# Patient Record
Sex: Female | Born: 1975 | Race: White | Hispanic: Yes | Marital: Married | Smoking: Never smoker
Health system: Southern US, Community
[De-identification: ages and names within clinical notes are randomized; demographics above are authoritative.]

## PROBLEM LIST (undated history)

## (undated) DIAGNOSIS — O24419 Gestational diabetes mellitus in pregnancy, unspecified control: Secondary | ICD-10-CM

## (undated) DIAGNOSIS — E119 Type 2 diabetes mellitus without complications: Secondary | ICD-10-CM

## (undated) HISTORY — DX: Gestational diabetes mellitus in pregnancy, unspecified control: O24.419

## (undated) HISTORY — PX: NO PAST SURGERIES: SHX2092

## (undated) HISTORY — DX: Type 2 diabetes mellitus without complications: E11.9

---

## 1998-06-04 ENCOUNTER — Inpatient Hospital Stay (HOSPITAL_COMMUNITY): Admission: AD | Admit: 1998-06-04 | Discharge: 1998-06-04 | Payer: Self-pay | Admitting: *Deleted

## 2000-01-07 ENCOUNTER — Emergency Department (HOSPITAL_COMMUNITY): Admission: EM | Admit: 2000-01-07 | Discharge: 2000-01-07 | Payer: Self-pay | Admitting: Emergency Medicine

## 2001-10-19 ENCOUNTER — Inpatient Hospital Stay (HOSPITAL_COMMUNITY): Admission: AD | Admit: 2001-10-19 | Discharge: 2001-10-21 | Payer: Self-pay | Admitting: *Deleted

## 2003-08-19 ENCOUNTER — Inpatient Hospital Stay (HOSPITAL_COMMUNITY): Admission: AD | Admit: 2003-08-19 | Discharge: 2003-08-19 | Payer: Self-pay | Admitting: *Deleted

## 2003-09-03 ENCOUNTER — Encounter: Admission: RE | Admit: 2003-09-03 | Discharge: 2003-09-03 | Payer: Self-pay | Admitting: *Deleted

## 2003-09-12 ENCOUNTER — Encounter: Admission: RE | Admit: 2003-09-12 | Discharge: 2003-10-28 | Payer: Self-pay | Admitting: Family Medicine

## 2003-10-01 ENCOUNTER — Encounter: Admission: RE | Admit: 2003-10-01 | Discharge: 2003-10-01 | Payer: Self-pay | Admitting: *Deleted

## 2003-10-08 ENCOUNTER — Encounter: Admission: RE | Admit: 2003-10-08 | Discharge: 2003-10-08 | Payer: Self-pay | Admitting: *Deleted

## 2003-10-15 ENCOUNTER — Encounter: Admission: RE | Admit: 2003-10-15 | Discharge: 2003-10-15 | Payer: Self-pay | Admitting: *Deleted

## 2003-10-22 ENCOUNTER — Encounter: Admission: RE | Admit: 2003-10-22 | Discharge: 2003-10-22 | Payer: Self-pay | Admitting: *Deleted

## 2003-10-28 ENCOUNTER — Inpatient Hospital Stay (HOSPITAL_COMMUNITY): Admission: AD | Admit: 2003-10-28 | Discharge: 2003-10-30 | Payer: Self-pay | Admitting: Family Medicine

## 2005-07-30 ENCOUNTER — Ambulatory Visit: Payer: Self-pay | Admitting: Internal Medicine

## 2005-07-30 ENCOUNTER — Ambulatory Visit: Payer: Self-pay | Admitting: *Deleted

## 2008-01-15 LAB — SICKLE CELL SCREEN: Sickle Cell Screen: NORMAL

## 2008-02-09 ENCOUNTER — Ambulatory Visit: Payer: Self-pay | Admitting: Internal Medicine

## 2008-02-20 ENCOUNTER — Encounter: Payer: Self-pay | Admitting: Family Medicine

## 2008-02-20 ENCOUNTER — Ambulatory Visit: Payer: Self-pay | Admitting: Internal Medicine

## 2008-02-20 LAB — CONVERTED CEMR LAB
ALT: 19 units/L (ref 0–35)
AST: 16 units/L (ref 0–37)
Albumin: 4.5 g/dL (ref 3.5–5.2)
Alkaline Phosphatase: 54 units/L (ref 39–117)
Basophils Relative: 0 % (ref 0–1)
Eosinophils Absolute: 0.1 10*3/uL (ref 0.0–0.7)
Lymphs Abs: 2.5 10*3/uL (ref 0.7–4.0)
MCHC: 33.6 g/dL (ref 30.0–36.0)
MCV: 87.5 fL (ref 78.0–100.0)
Neutrophils Relative %: 58 % (ref 43–77)
Platelets: 298 10*3/uL (ref 150–400)
Potassium: 4 meq/L (ref 3.5–5.3)
Sodium: 141 meq/L (ref 135–145)
Total CHOL/HDL Ratio: 2.6
Total Protein: 7.6 g/dL (ref 6.0–8.3)
VLDL: 13 mg/dL (ref 0–40)
WBC: 7.4 10*3/uL (ref 4.0–10.5)

## 2008-07-19 ENCOUNTER — Ambulatory Visit: Payer: Self-pay | Admitting: Internal Medicine

## 2008-08-15 ENCOUNTER — Ambulatory Visit: Payer: Self-pay | Admitting: Internal Medicine

## 2008-08-15 ENCOUNTER — Encounter: Payer: Self-pay | Admitting: Family Medicine

## 2008-08-15 LAB — CONVERTED CEMR LAB
ALT: 17 units/L (ref 0–35)
AST: 16 units/L (ref 0–37)
Albumin: 4.2 g/dL (ref 3.5–5.2)
CO2: 24 meq/L (ref 19–32)
Calcium: 8.8 mg/dL (ref 8.4–10.5)
Glucose, Bld: 102 mg/dL — ABNORMAL HIGH (ref 70–99)
Sodium: 139 meq/L (ref 135–145)
Total Protein: 7.4 g/dL (ref 6.0–8.3)

## 2008-10-14 ENCOUNTER — Ambulatory Visit: Payer: Self-pay | Admitting: Internal Medicine

## 2009-03-10 ENCOUNTER — Ambulatory Visit: Payer: Self-pay | Admitting: Internal Medicine

## 2009-03-10 ENCOUNTER — Encounter: Payer: Self-pay | Admitting: Family Medicine

## 2009-03-10 LAB — CONVERTED CEMR LAB
AST: 16 units/L (ref 0–37)
Albumin: 4.4 g/dL (ref 3.5–5.2)
Alkaline Phosphatase: 57 units/L (ref 39–117)
Basophils Absolute: 0 10*3/uL (ref 0.0–0.1)
HCT: 40.1 % (ref 36.0–46.0)
Lymphs Abs: 2.8 10*3/uL (ref 0.7–4.0)
MCV: 87.9 fL (ref 78.0–100.0)
Platelets: 301 10*3/uL (ref 150–400)
Potassium: 4.2 meq/L (ref 3.5–5.3)
RDW: 12.5 % (ref 11.5–15.5)
Sodium: 138 meq/L (ref 135–145)
Total Bilirubin: 0.4 mg/dL (ref 0.3–1.2)
Total Protein: 7.2 g/dL (ref 6.0–8.3)
WBC: 8.2 10*3/uL (ref 4.0–10.5)

## 2009-03-12 ENCOUNTER — Ambulatory Visit (HOSPITAL_COMMUNITY): Admission: RE | Admit: 2009-03-12 | Discharge: 2009-03-12 | Payer: Self-pay | Admitting: Family Medicine

## 2012-03-08 ENCOUNTER — Other Ambulatory Visit (HOSPITAL_COMMUNITY): Payer: Self-pay | Admitting: Gynecology

## 2012-03-08 DIAGNOSIS — Z3689 Encounter for other specified antenatal screening: Secondary | ICD-10-CM

## 2012-03-08 LAB — OB RESULTS CONSOLE HGB/HCT, BLOOD
HCT: 33 %
Hemoglobin: 12 g/dL

## 2012-03-08 LAB — OB RESULTS CONSOLE RPR: RPR: NONREACTIVE

## 2012-03-08 LAB — OB RESULTS CONSOLE PLATELET COUNT: Platelets: 273 10*3/uL

## 2012-03-08 LAB — CYTOLOGY - PAP: Pap: NEGATIVE

## 2012-03-08 LAB — OB RESULTS CONSOLE ABO/RH: RH Type: POSITIVE

## 2012-03-09 LAB — GLUCOSE TOLERANCE, 3 HOURS
Glucose, GTT - 3 Hour: 171 mg/dL — AB (ref ?–140)
Glucose, GTT - Fasting: 71 mg/dL — AB (ref 80–110)

## 2012-03-20 ENCOUNTER — Encounter: Payer: Self-pay | Admitting: Obstetrics & Gynecology

## 2012-03-20 ENCOUNTER — Ambulatory Visit (INDEPENDENT_AMBULATORY_CARE_PROVIDER_SITE_OTHER): Payer: Self-pay | Admitting: Obstetrics & Gynecology

## 2012-03-20 ENCOUNTER — Encounter: Payer: Self-pay | Admitting: *Deleted

## 2012-03-20 ENCOUNTER — Encounter: Payer: Self-pay | Attending: Obstetrics & Gynecology | Admitting: Dietician

## 2012-03-20 VITALS — BP 102/65 | Temp 97.4°F | Ht <= 58 in | Wt 118.0 lb

## 2012-03-20 DIAGNOSIS — O99810 Abnormal glucose complicating pregnancy: Secondary | ICD-10-CM | POA: Insufficient documentation

## 2012-03-20 DIAGNOSIS — Z713 Dietary counseling and surveillance: Secondary | ICD-10-CM | POA: Insufficient documentation

## 2012-03-20 DIAGNOSIS — O0990 Supervision of high risk pregnancy, unspecified, unspecified trimester: Secondary | ICD-10-CM | POA: Insufficient documentation

## 2012-03-20 LAB — POCT URINALYSIS DIP (DEVICE)
Hgb urine dipstick: NEGATIVE
Leukocytes, UA: NEGATIVE
Nitrite: NEGATIVE
Specific Gravity, Urine: 1.025 (ref 1.005–1.030)

## 2012-03-20 NOTE — Progress Notes (Signed)
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 Pt has gained 15# @ [redacted]w[redacted]d, which is > expected. Pt reports eating 2-3 meals & 2 snack/d. Pt reports drinking water & milk daily.  Pt able to recall only a few foods that affect her BS. Pt reports no N&V or heartburn. Pt reports taking PNV. Pt reports NKFA. Pt reports walking occ. Pt received written & verbal GDM education in [place]. Reviewed foods that affect BS. Discussed importance of walking to help regulate BS. Disc wt gain goals of 25-35# or 1#/wk.  Pt agrees to eat 3 meals & 3 snacks/ d with proper CHO/ protein combination. Pt does receive WIC. F/u in 2-4 wks Blondell Reveal, MS, RD, LDN

## 2012-03-20 NOTE — Progress Notes (Signed)
Pt has had GDM with all 4 pregnancies.  Controlled with diet.  Needs diabetes education.  Has anatomy scan scheduled.  Needs rest of labs from health department.  Had complete physical exam and pap smear there.  Husband is considering vasectomy.  Pt refuses genetic testing.

## 2012-03-20 NOTE — Progress Notes (Signed)
Pulse- 96 

## 2012-03-20 NOTE — Progress Notes (Signed)
Diabetes Education:  Seen for the first time today.  Review of the physiology of pregnancy.  She speaks [place] and I proceeded slowly with the instruction.  Alex the Veterinary surgeon checked with her to inquire if there was any thing that she did not understand.  Review of the factors that influence blood glucose, the S/S and treatment of hypoglycemia.  Provided a True Track meter, Lot: [id number] Exp: 2014/02/18, 1 box strips [id number] EXP: 2014/04/08, and 1 box of lancets [id number] Exp: 2015/10/04.  Provided meter instructions and on return demonstration her blood glucose fasting this AM at 9:30 was 107.  Provide English and Spanish handouts for GDM "Nutrition, Diabetes and Pregnancy".  Maggie [place], RN, RD, CDE.

## 2012-04-03 ENCOUNTER — Ambulatory Visit (HOSPITAL_COMMUNITY)
Admission: RE | Admit: 2012-04-03 | Discharge: 2012-04-03 | Disposition: A | Discharge status: Home / Self Care | Payer: Self-pay | Source: Ambulatory Visit | Attending: Gynecology | Admitting: Gynecology

## 2012-04-03 ENCOUNTER — Encounter: Payer: Self-pay | Admitting: Family Medicine

## 2012-04-03 ENCOUNTER — Ambulatory Visit (INDEPENDENT_AMBULATORY_CARE_PROVIDER_SITE_OTHER): Payer: Self-pay | Admitting: Family Medicine

## 2012-04-03 VITALS — BP 103/69 | Temp 97.0°F | Wt 119.8 lb

## 2012-04-03 DIAGNOSIS — O99810 Abnormal glucose complicating pregnancy: Secondary | ICD-10-CM | POA: Insufficient documentation

## 2012-04-03 DIAGNOSIS — O0990 Supervision of high risk pregnancy, unspecified, unspecified trimester: Secondary | ICD-10-CM

## 2012-04-03 DIAGNOSIS — Z3689 Encounter for other specified antenatal screening: Secondary | ICD-10-CM | POA: Insufficient documentation

## 2012-04-03 DIAGNOSIS — Z23 Encounter for immunization: Secondary | ICD-10-CM

## 2012-04-03 LAB — COMPREHENSIVE METABOLIC PANEL
ALT: 31 U/L (ref 0–35)
CO2: 21 mEq/L (ref 19–32)
Calcium: 9 mg/dL (ref 8.4–10.5)
Chloride: 105 mEq/L (ref 96–112)
Creat: 0.52 mg/dL (ref 0.50–1.10)
Glucose, Bld: 87 mg/dL (ref 70–99)

## 2012-04-03 LAB — POCT URINALYSIS DIP (DEVICE)
Bilirubin Urine: NEGATIVE
Glucose, UA: NEGATIVE mg/dL
Hgb urine dipstick: NEGATIVE
Nitrite: NEGATIVE
Urobilinogen, UA: 1 mg/dL (ref 0.0–1.0)

## 2012-04-03 LAB — HEMOGLOBIN A1C
Hgb A1c MFr Bld: 5.7 % — ABNORMAL HIGH (ref <5.7)
Mean Plasma Glucose: 117 mg/dL — ABNORMAL HIGH (ref <117)

## 2012-04-03 LAB — TSH: TSH: 2.511 u[IU]/mL (ref 0.350–4.500)

## 2012-04-03 MED ORDER — INFLUENZA VIRUS VACC SPLIT PF IM SUSP
0.5000 mL | Freq: Once | INTRAMUSCULAR | Status: AC
  Administered 2012-04-03: 0.5 mL via INTRAMUSCULAR

## 2012-04-03 NOTE — Progress Notes (Signed)
Forgot BS today had anatomy upstairs this morning.Will get baseline labs, 24 hour urine, TSH, Hgb A1C Optho and Fetal Echo

## 2012-04-03 NOTE — Progress Notes (Signed)
Pulse: 85

## 2012-04-03 NOTE — Patient Instructions (Signed)
Diabetes mellitus gestacional (Gestational Diabetes Mellitus) La diabetes mellitus gestacional se produce slo durante el embarazo. Aparece cuando el organismo no puede controlar adecuadamente la glucosa (azcar) que aumenta en la sangre despus de comer. Durante el embarazo, se produce una resistencia a la insulina (sensibilidad reducida a la insulina) debido a la liberacin de hormonas por parte de la placenta. Generalmente, el pncreas de una mujer embarazada produce la cantidad suficiente de insulina para vencer esa resistencia. Sin embargo, en la diabetes gestacional, hay insulina pero no cumple su funcin adecuadamente. Si la resistencia es lo suficientemente grave como para que el pncreas no produzca la cantidad de insulina suficiente, la glucosa extra se acumula en la sangre.  QUINES TIENEN RIESGO DE DESARROLLAR DIABETES GESTACIONAL?  Las mujeres con historia de diabetes en la familia.  Las mujeres de ms de 25 aos.  Las que presentan sobrepeso.  Las mujeres que pertenecen a ciertos grupos tnicos (latinas, afroamericanas, norteamericanas nativas, asiticas y las originarias de las islas del Pacfico. QUE PUEDE OCURRIRLE AL BEB? Si el nivel de glucosa en sangre de la madre es demasiado elevado mientras este embarazada, el nivel extra de azcar pasar por el cordn umbilical hacia el beb. Algunos de los problemas del beb pueden ser:  Beb demasiado grande: si el nio recibe demasiada azcar, puede aumentar mucho de peso. Esto puede hacer que sea demasiado grande para nacer por parto normal (vaginal) por lo que ser necesario realizar una cesrea.  Bajo nivel de glucosa (hipoglucemia): el beb produce insulina extra en respuesta a la excesiva cantidad de azcar que obtiene de la madre. Cuando el beb nace y ya no necesita insulina extra, su nivel de azcar en sangre puede disminuir.  Ictericia (coloracin amarillenta de la piel y los ojos): esto es bastante frecuente en los bebs.  La causa es la acumulacin de una sustancia qumica denominada bilirrubina. No siempre es un trastorno grave, pero se observa con frecuencia en los bebs cuyas madres sufren diabetes gestacional. RIESGOS PARA LA MADRE Las mujeres que han sufrido diabetes gestacional pueden tener ms riesgos para algunos problemas como:  Preeclampsia o toxemia, incluyendo problemas con hipertensin arterial. La presin arterial y los niveles de protenas en la orina deben controlarse con frecuencia.  Infecciones  Parto por cesrea.  Aparicin de diabetes tipo 2 en una etapa posterior de la vida. Alrededor del 30% al 50% sufrir diabetes posteriormente, especialmente las que son obesas. DIAGNSTICO Las hormonas que causan resistencia a la insulina tienen su mayor nivel alrededor de las 24 a 28 semanas del embarazo. Si se experimentan sntomas, stos son similares a los sntomas que normalmente aparecen durante el embarazo.  La diabetes mellitus gestacional generalmente se diagnostica por medio de un mtodo en dos partes: 1. Despus de la 24 a 28 semanas de embarazo, la mujer debe beber una solucin que contiene glucosa y realizar un anlisis de sangre. Si el nivel de glucosa es elevado, la realizarn un segundo anlisis. 2. La prueba oral de tolerancia a la glucosa, que dura aproximadamente tres horas. Despus de realizar ayuno durante la noche, se controla nivel de glucosa en sangre. La mujer bebe una solucin que contiene glucosa y le realizan anlisis de glucosa en sangre cada hora. Si la mujer tiene factores de riesgos para la diabetes mellitus gestacional, el mdico podr indicar el anlisis antes de las 24 semanas de embarazo. TRATAMIENTO El tratamiento est dirigido a mantener la glucosa en sangre de la madre en un nivel normal y puede incluir:  La   planificacin de los alimentos.  Recibir insulina u otro medicamento para Sales executive nivel de glucosa en [place].  La prctica de ejercicios.  Llevar un  registro diario de los alimentos que consume.  Control y Engineer, maintenance (IT) de los niveles de glucosa en [place].  Control de los niveles de cetona en la [place], [place] esto ya no se considera necesario en la mayora de los [place]. INSTRUCCIONES PARA EL CUIDADO DOMICILIARIO Mientras est embarazada:  Siga los consejos de su mdico relacionados con los controles prenatales, la planificacin de la comida, la actividad fsica, los [place], vitaminas, los anlisis de sangre y otras pruebas y las actividades fsicas.  Lleve un registro de las comidas, las pruebas de glucosa en sangre y la cantidad de insulina que recibe (si corresponde). Muestre todo al profesional en cada consulta mdica prenatal.  Si sufre diabetes mellitus gestacional, podr tener problemas de hipoglucemia (nivel bajo de glucosa en sangre). Podr sospechar este problema si se siente repentinamente mareada, tiene temblores y/o se siente dbil. Si cree que esto le est ocurriendo, y tiene un medidor de glucosa, mida su nivel de Event organiser. Siga los consejos de su mdico sobre el modo y el momento de tratar su nivel de glucosa en sangre. Generalmente se sigue la regla 15:15 Consuma 15 g de hidratos de carbono, espere 15 minutos y Programmer, systems el nivel de glucosa en [place].Barbara Cower de 15 g de hidratos de carbono son:  1 taza de [employer].   taza de jugo.  3-4 tabletas de glucosa.  5-6 caramelos duros.  1 caja pequea de pasas de uva.   taza de gaseosa comn.  Mantenga una buena higiene para evitar infecciones.  No fume. SOLICITE ATENCIN MDICA SI:  Observa prdida vaginal con o sin picazn.  Se siente ms dbil o cansada que lo habitual.  Primus [place]ravo.  Tiene un aumento de peso repentino, 2,5 kg o ms en una semana.  Pierde peso, 1.5 kg o ms en una semana.  Su nivel de glucosa en sangre es elevado, necesita instrucciones. SOLICITE ATENCIN MDICA DE INMEDIATO SI:  Sufre una cefalea  intensa.  Se marea o pierde el conocimiento  Presenta nuseas o vmitos.  Se siente desorientada confundida.  Sufre convulsiones.  Tiene problemas de visin.  Siente Physiological scientist.  Presenta una hemorragia vaginal abundante.  Tiene contracciones uterinas.  Tiene una prdida importante de lquido por la vagina DESPUS QUE NACE EL BEB:  Concurra a todos los controles de seguimiento y Clinical biochemist los anlisis de sangre segn las indicaciones de su mdico.  Mantenga un estilo de vida saludable para evitar la diabetes en el futuro. Aqu se incluye:  Siga el plan de alimentacin saludable.  Controle su peso.  Practique actividad fsica y descanse lo necesario.  No fume.  Amamante a su beb mientras pueda. Esto disminuir la probabilidad de que usted y su beb sufran diabetes posteriormente. Para ms informacin acerca de la diabetes, visite la pgina web de Holiday representative Diabetes Association: [email or web address].cy. Para ms informacin acerca de la diabetes gestacional cite la pgina web del [employer] en: [email or web address]. Document Released: 01/27/2005 Document Revised: 07/12/2011 [facility] Patient Information 2013 [place], [place].  Eleccin del mtodo anticonceptivo  (Contraception Choices) La anticoncepcin (control de la natalidad) es el uso de cualquier mtodo o dispositivo para Location manager. A continuacin se indican algunos de esos mtodos.  MTODOS HORMONALES   Implante anticonceptivo. Es un tubo plstico delgado que contiene la hormona progesterona.  No contiene estrgenos. El mdico inserta el tubo en la parte interna del brazo. El tubo puede Geneticist, molecular durante 3 aos. Despus de los 3 aos debe retirarse. El implante impide que los ovarios liberen vulos (ovulacin), espesa el moco cervical, lo que evita que los espermatozoides ingresen al tero y hace ms delgada la membrana que cubre el interior  del tero.  Inyecciones de progesterona sola. Estas inyecciones se administran cada 3 meses para evitar el embarazo. La progesterona sinttica impide que los ovarios liberen vulos. Tambin hace que el moco cervical se espese y modifica el recubrimiento interno del tero. Esto hace ms difcil que los espermatozoides sobrevivan en el tero.  Pldoras anticonceptivas. Las pldoras anticonceptivas contienen estrgenos y Education officer, museum. Actan impidiendo que el vulo se forme en el ovario(ovulacin). Las pldoras anticonceptivas son recetadas por el mdico.Tambin se utilizan para tratar los perodos menstruales abundantes.  Minipldora. Este tipo de pldora anticonceptiva contiene slo hormona progesterona. Deben tomarse todos los [address]  [address] del mes y debe recetarlas el mdico.  Parches anticonceptivos. El parche contiene hormonas similares a las que contienen las pldoras anticonceptivas. Deben cambiarse una vez por semana y se utilizan bajo prescripcin mdica.  Anillo vaginal. Anillo vaginal contiene hormonas similares a las que contienen las pldoras anticonceptivas. Se deja colocado durante tres semanas, se lo retira durante 1 semana y luego se coloca uno nuevo. La paciente debe sentirse cmoda para insertar y retirar el anillo de la vagina.Es necesaria la receta del mdico.  Anticonceptivos de Associate Professor. Los anticonceptivos de emergencia son mtodos para evitar un embarazo despus de una relacin sexual sin proteccin. Esta pldora puede tomarse inmediatamente despus de Child psychotherapist sexuales o hasta 5 [place] de haber tenido sexo sin proteccin. Es ms efectiva si se toma poco tiempo despus. Los anticonceptivos de emergencia estn disponibles sin prescripcin mdica. Consltelo con su farmacutico. No use los anticonceptivos de emergencia como nico mtodo anticonceptivo. MTODOS DE BARRERA   Condn masculino. Es una vaina delgada (ltex o goma) que se [place] en el pene durante el acto sexual. Deri Fuelling con espermicida para aumentar la efectividad.  Condn femenino. Es una vaina blanda y floja que se adapta suavemente a la vagina antes de las relaciones sexuales.  Diafragma. Es una barrera de ltex redonda y Casimer Bilis que debe ser ajustada por un profesional. Se inserta en la vagina, junto con un gel espermicida. Debe insertarse antes de Management consultant. Debe dejar el diafragma colocado en la vagina durante 6 a 8 horas despus de la relacin sexual.  Capuchn cervical. Es una taza de ltex o plstico, redonda y [place] que cubre el cuello del tero y debe ser ajustada por un mdico. Puede dejarlo colocado en la vagina hasta 48 horas despus de las Clinical research associate.  Esponja. Es una pieza blanda y circular de espuma de poliuretano. Contiene un espermicida. Se inserta en la vagina despus de mojarla y antes de las [employer].  Espermicidas. Los espermicidas son qumicos que matan o bloquean el esperma y no lo dejan ingresar al cuello del tero y al tero. Vienen en forma de cremas, geles, supositorios, espuma o comprimidos. No es necesario tener Emergency planning/management officer. Se insertan en la vagina con un aplicador antes de Management consultant. El proceso debe repetirse cada vez que tiene relaciones sexuales. ANTICONCEPTIVOS INTRAUTERINOS   Dispositivo intrauterino (DIU). Es un dispositivo en forma de T que se coloca en el tero durante el perodo menstrual, para Location manager. Hay dos tipos:  DIU de  cobreCleda Clarks tipo de DIU est recubierto con un alambre de cobre y se inserta dentro del tero. El cobre hace que el tero y las trompas de Falopio produzcan un liquido que [employer] espermatozoides. Puede permanecer colocado durante 10 aos.  DIU hormonal. Este tipo de DIU contiene la hormona progestina (progesterona sinttica). La hormona espesa el moco cervical y evita que los espermatozoides ingresen al tero y tambin afina la membrana que cubre el tero para evitar la  implantacin del vulo fertilizado. La hormona debilita o destruye los espermatozoides que ingresan al tero. Puede permanecer colocado durante 5 aos. MTODOS ANTICONCEPTIVOS PERMANENTES   Ligadura de trompas en la mujer. La ligadura de trompas en la mujer se realiza sellando, atando u obstruyendo quirrgicamente las trompas de Falopio lo que impide que el vulo descienda hacia el tero.  Esterilizacin masculina. Se realiza atando los conductos por los que pasan los espermatozoides (vasectoma).Esto impide que el esperma ingrese a la vagina durante el acto sexual. Luego del procedimiento, el hombre puede eyacular lquido (semen). MTODOS DE PLANIFICACIN NATURAL   Planificacin familiar natural.  Consiste en no tener relaciones sexuales o usar un mtodo de barrera (condn, [place], capuchn cervical) en los [employer] la mujer podra quedar [place].  Mtodo calendario.  Consiste en el seguimiento de la duracin de cada ciclo menstrual y la identificacin de los perodos frtiles.  Mtodo de Occupational hygienist.  Consiste en evitar las relaciones sexuales durante la ovulacin.  Mtodo sintotrmico. Paramedic las relaciones sexuales en la poca en la que se est ovulando, utilizando un termmetro y tendiendo en cuenta los sntomas de la ovulacin.  Mtodo post-ovulacin. Consiste en planificar las relaciones sexuales para despus de haber ovulado. Independientemente del tipo o mtodo anticonceptivo que usted elija, es importante que use condones para protegerse contra las enfermedades de transmisin sexual (ETS). Hable con su mdico con respecto a qu mtodo anticonceptivo es el ms apropiado para usted.  Document Released: 04/19/2005 Document Revised: 07/12/2011 [facility] Patient Information 2013 [place], [place].

## 2012-04-03 NOTE — Progress Notes (Signed)
Flu shot today 

## 2012-04-04 ENCOUNTER — Encounter: Payer: Self-pay | Admitting: Family Medicine

## 2012-04-14 LAB — COMPREHENSIVE METABOLIC PANEL
AST: 35 U/L (ref 0–37)
Albumin: 3.4 g/dL — ABNORMAL LOW (ref 3.5–5.2)
Alkaline Phosphatase: 57 U/L (ref 39–117)
CO2: 20 mEq/L (ref 19–32)
Calcium: 8.5 mg/dL (ref 8.4–10.5)
Creat: 0.47 mg/dL — ABNORMAL LOW (ref 0.50–1.10)
Sodium: 137 mEq/L (ref 135–145)

## 2012-04-14 NOTE — Addendum Note (Signed)
Addended by: Franchot Mimes on: 04/14/2012 09:38 AM   Modules accepted: Orders

## 2012-04-15 LAB — CREATININE CLEARANCE, URINE, 24 HOUR
Creatinine Clearance: 139 mL/min — ABNORMAL HIGH (ref 75–115)
Creatinine, 24H Ur: 941 mg/d (ref 700–1800)
Creatinine: 0.47 mg/dL — ABNORMAL LOW (ref 0.50–1.10)

## 2012-04-15 LAB — PROTEIN, URINE, 24 HOUR: Protein, 24H Urine: 94 mg/d (ref 50–100)

## 2012-04-17 ENCOUNTER — Ambulatory Visit (INDEPENDENT_AMBULATORY_CARE_PROVIDER_SITE_OTHER): Payer: Self-pay | Admitting: Obstetrics & Gynecology

## 2012-04-17 ENCOUNTER — Encounter: Payer: Self-pay | Admitting: Family Medicine

## 2012-04-17 ENCOUNTER — Telehealth: Payer: Self-pay | Admitting: Obstetrics and Gynecology

## 2012-04-17 VITALS — BP 102/72 | Temp 98.5°F | Wt 122.0 lb

## 2012-04-17 DIAGNOSIS — O0990 Supervision of high risk pregnancy, unspecified, unspecified trimester: Secondary | ICD-10-CM

## 2012-04-17 DIAGNOSIS — O99810 Abnormal glucose complicating pregnancy: Secondary | ICD-10-CM

## 2012-04-17 LAB — POCT URINALYSIS DIP (DEVICE)
Bilirubin Urine: NEGATIVE
Hgb urine dipstick: NEGATIVE
Ketones, ur: NEGATIVE mg/dL
Leukocytes, UA: NEGATIVE
Specific Gravity, Urine: 1.025 (ref 1.005–1.030)
pH: 6.5 (ref 5.0–8.0)

## 2012-04-17 MED ORDER — GLYBURIDE 5 MG PO TABS: 5.0000 mg | ORAL_TABLET | Freq: Every day | ORAL | Status: DC

## 2012-04-17 NOTE — Progress Notes (Signed)
Pulse- 94 

## 2012-04-17 NOTE — Patient Instructions (Signed)
Pregnancy - Second Trimester The second trimester of pregnancy (3 to 6 months) is a period of rapid growth for you and your baby. At the end of the sixth month, your baby is about 9 inches long and weighs 1 1/2 pounds. You will begin to feel the baby move between 18 and 20 weeks of the pregnancy. This is called quickening. Weight gain is faster. A clear fluid (colostrum) may leak out of your breasts. You may feel small contractions of the womb (uterus). This is known as false labor or Braxton-Hicks contractions. This is like a practice for labor when the baby is ready to be born. Usually, the problems with morning sickness have usually passed by the end of your first trimester. Some women develop small dark blotches (called cholasma, mask of pregnancy) on their face that usually goes away after the baby is born. Exposure to the sun makes the blotches worse. Acne may also develop in some pregnant women and pregnant women who have acne, may find that it goes away. PRENATAL EXAMS  Blood work may continue to be done during prenatal exams. These tests are done to check on your health and the probable health of your baby. Blood work is used to follow your blood levels (hemoglobin). Anemia (low hemoglobin) is common during pregnancy. Iron and vitamins are given to help prevent this. You will also be checked for diabetes between 24 and 28 weeks of the pregnancy. Some of the previous blood tests may be repeated.  The size of the uterus is measured during each visit. This is to make sure that the baby is continuing to grow properly according to the dates of the pregnancy.  Your blood pressure is checked every prenatal visit. This is to make sure you are not getting toxemia.  Your urine is checked to make sure you do not have an infection, diabetes or protein in the urine.  Your weight is checked often to make sure gains are happening at the suggested rate. This is to ensure that both you and your baby are growing  normally.  Sometimes, an ultrasound is performed to confirm the proper growth and development of the baby. This is a test which bounces harmless sound waves off the baby so your caregiver can more accurately determine due dates. Sometimes, a specialized test is done on the amniotic fluid surrounding the baby. This test is called an amniocentesis. The amniotic fluid is obtained by sticking a needle into the belly (abdomen). This is done to check the chromosomes in instances where there is a concern about possible genetic problems with the baby. It is also sometimes done near the end of pregnancy if an early delivery is required. In this case, it is done to help make sure the baby's lungs are mature enough for the baby to live outside of the womb. CHANGES OCCURING IN THE SECOND TRIMESTER OF PREGNANCY Your body goes through many changes during pregnancy. They vary from person to person. Talk to your caregiver about changes you notice that you are concerned about.  During the second trimester, you will likely have an increase in your appetite. It is normal to have cravings for certain foods. This varies from person to person and pregnancy to pregnancy.  Your lower abdomen will begin to bulge.  You may have to urinate more often because the uterus and baby are pressing on your bladder. It is also common to get more bladder infections during pregnancy (pain with urination). You can help this by   drinking lots of fluids and emptying your bladder before and after intercourse.  You may begin to get stretch marks on your hips, abdomen, and breasts. These are normal changes in the body during pregnancy. There are no exercises or medications to take that prevent this change.  You may begin to develop swollen and bulging veins (varicose veins) in your legs. Wearing support hose, elevating your feet for 15 minutes, 3 to 4 times a day and limiting salt in your diet helps lessen the problem.  Heartburn may develop  as the uterus grows and pushes up against the stomach. Antacids recommended by your caregiver helps with this problem. Also, eating smaller meals 4 to 5 times a day helps.  Constipation can be treated with a stool softener or adding bulk to your diet. Drinking lots of fluids, vegetables, fruits, and whole grains are helpful.  Exercising is also helpful. If you have been very active up until your pregnancy, most of these activities can be continued during your pregnancy. If you have been less active, it is helpful to start an exercise program such as walking.  Hemorrhoids (varicose veins in the rectum) may develop at the end of the second trimester. Warm sitz baths and hemorrhoid cream recommended by your caregiver helps hemorrhoid problems.  Backaches may develop during this time of your pregnancy. Avoid heavy lifting, wear low heal shoes and practice good posture to help with backache problems.  Some pregnant women develop tingling and numbness of their hand and fingers because of swelling and tightening of ligaments in the wrist (carpel tunnel syndrome). This goes away after the baby is born.  As your breasts enlarge, you may have to get a bigger bra. Get a comfortable, cotton, support bra. Do not get a nursing bra until the last month of the pregnancy if you will be nursing the baby.  You may get a dark line from your belly button to the pubic area called the linea nigra.  You may develop rosy cheeks because of increase blood flow to the face.  You may develop spider looking lines of the face, neck, arms and chest. These go away after the baby is born. HOME CARE INSTRUCTIONS   It is extremely important to avoid all smoking, herbs, alcohol, and unprescribed drugs during your pregnancy. These chemicals affect the formation and growth of the baby. Avoid these chemicals throughout the pregnancy to ensure the delivery of a healthy infant.  Most of your home care instructions are the same as  suggested for the first trimester of your pregnancy. Keep your caregiver's appointments. Follow your caregiver's instructions regarding medication use, exercise and diet.  During pregnancy, you are providing food for you and your baby. Continue to eat regular, well-balanced meals. Choose foods such as meat, fish, milk and other low fat dairy products, vegetables, fruits, and whole-grain breads and cereals. Your caregiver will tell you of the ideal weight gain.  A physical sexual relationship may be continued up until near the end of pregnancy if there are no other problems. Problems could include early (premature) leaking of amniotic fluid from the membranes, vaginal bleeding, abdominal pain, or other medical or pregnancy problems.  Exercise regularly if there are no restrictions. Check with your caregiver if you are unsure of the safety of some of your exercises. The greatest weight gain will occur in the last 2 trimesters of pregnancy. Exercise will help you:  Control your weight.  Get you in shape for labor and delivery.  Lose weight   after you have the baby.  Wear a good support or jogging bra for breast tenderness during pregnancy. This may help if worn during sleep. Pads or tissues may be used in the bra if you are leaking colostrum.  Do not use hot tubs, steam rooms or saunas throughout the pregnancy.  Wear your seat belt at all times when driving. This protects you and your baby if you are in an accident.  Avoid raw meat, uncooked cheese, cat litter boxes and soil used by cats. These carry germs that can cause birth defects in the baby.  The second trimester is also a good time to visit your dentist for your dental health if this has not been done yet. Getting your teeth cleaned is OK. Use a soft toothbrush. Brush gently during pregnancy.  It is easier to loose urine during pregnancy. Tightening up and strengthening the pelvic muscles will help with this problem. Practice stopping your  urination while you are going to the bathroom. These are the same muscles you need to strengthen. It is also the muscles you would use as if you were trying to stop from passing gas. You can practice tightening these muscles up 10 times a set and repeating this about 3 times per day. Once you know what muscles to tighten up, do not perform these exercises during urination. It is more likely to contribute to an infection by backing up the urine.  Ask for help if you have financial, counseling or nutritional needs during pregnancy. Your caregiver will be able to offer counseling for these needs as well as refer you for other special needs.  Your skin may become oily. If so, wash your face with mild soap, use non-greasy moisturizer and oil or cream based makeup. MEDICATIONS AND DRUG USE IN PREGNANCY  Take prenatal vitamins as directed. The vitamin should contain 1 milligram of folic acid. Keep all vitamins out of reach of children. Only a couple vitamins or tablets containing iron may be fatal to a baby or young child when ingested.  Avoid use of all medications, including herbs, over-the-counter medications, not prescribed or suggested by your caregiver. Only take over-the-counter or prescription medicines for pain, discomfort, or fever as directed by your caregiver. Do not use aspirin.  Let your caregiver also know about herbs you may be using.  Alcohol is related to a number of birth defects. This includes fetal alcohol syndrome. All alcohol, in any form, should be avoided completely. Smoking will cause low birth rate and premature babies.  Street or illegal drugs are very harmful to the baby. They are absolutely forbidden. A baby born to an addicted mother will be addicted at birth. The baby will go through the same withdrawal an adult does. SEEK MEDICAL CARE IF:  You have any concerns or worries during your pregnancy. It is better to call with your questions if you feel they cannot wait, rather  than worry about them. SEEK IMMEDIATE MEDICAL CARE IF:   An unexplained oral temperature above 102 F (38.9 C) develops, or as your caregiver suggests.  You have leaking of fluid from the vagina (birth canal). If leaking membranes are suspected, take your temperature and tell your caregiver of this when you call.  There is vaginal spotting, bleeding, or passing clots. Tell your caregiver of the amount and how many pads are used. Light spotting in pregnancy is common, especially following intercourse.  You develop a bad smelling vaginal discharge with a change in the color from clear   to white.  You continue to feel sick to your stomach (nauseated) and have no relief from remedies suggested. You vomit blood or coffee ground-like materials.  You lose more than 2 pounds of weight or gain more than 2 pounds of weight over 1 week, or as suggested by your caregiver.  You notice swelling of your face, hands, feet, or legs.  You get exposed to German measles and have never had them.  You are exposed to fifth disease or chickenpox.  You develop belly (abdominal) pain. Round ligament discomfort is a common non-cancerous (benign) cause of abdominal pain in pregnancy. Your caregiver still must evaluate you.  You develop a bad headache that does not go away.  You develop fever, diarrhea, pain with urination, or shortness of breath.  You develop visual problems, blurry, or double vision.  You fall or are in a car accident or any kind of trauma.  There is mental or physical violence at home. Document Released: 04/13/2001 Document Revised: 07/12/2011 Document Reviewed: 10/16/2008 [employer] Patient Information 2013 [employer].  

## 2012-04-17 NOTE — Progress Notes (Signed)
Fetal Echo scheduled for 05/08/12 at 1100 am. Pt aware.

## 2012-04-17 NOTE — Telephone Encounter (Signed)
Called patient and notified of fetal Echo appointment with Dr. Elizebeth Brooking [phone]) on 04/20/12 @0940 . Patient agrees and satisfied.

## 2012-04-17 NOTE — Progress Notes (Signed)
FBS [id number]. PP [id number]. Will start glyburide 5 mg AM. Normal [place] on 12/2

## 2012-04-17 NOTE — Progress Notes (Signed)
Patient given contact information for Dr. Casilda Carls office. Patient advised we will call her with the Fetal Echo appointment.

## 2012-04-24 ENCOUNTER — Encounter: Payer: Self-pay | Attending: Obstetrics & Gynecology | Admitting: Dietician

## 2012-04-24 ENCOUNTER — Ambulatory Visit (INDEPENDENT_AMBULATORY_CARE_PROVIDER_SITE_OTHER): Payer: Self-pay | Admitting: Obstetrics and Gynecology

## 2012-04-24 VITALS — BP 109/66 | Temp 97.6°F | Wt 122.3 lb

## 2012-04-24 DIAGNOSIS — O99810 Abnormal glucose complicating pregnancy: Secondary | ICD-10-CM

## 2012-04-24 DIAGNOSIS — Z713 Dietary counseling and surveillance: Secondary | ICD-10-CM | POA: Insufficient documentation

## 2012-04-24 LAB — POCT URINALYSIS DIP (DEVICE)
Glucose, UA: NEGATIVE mg/dL
Nitrite: NEGATIVE
Protein, ur: NEGATIVE mg/dL
Urobilinogen, UA: 0.2 mg/dL (ref 0.0–1.0)

## 2012-04-24 NOTE — Progress Notes (Signed)
Pulse: 85

## 2012-04-24 NOTE — Progress Notes (Signed)
Diabetes Education:  Review of glucose readings reveals elevations (3-4 out of 6) at fasting time.  Post breakfast are elevated 5 out of 7.  Review of the need to keep the starch/carb at 1-2 servings (25-30 gm).  Post-lunch levels are all WNL. Post-dinner levels are all elevated.  Review of the need to decrease the amount of carb at dinner. Suggested she take part to the dinner carb and have as an afternoon and part as a HS snack.  To start Glyburide in the AM.  Will need to follow-up next week.  Maggie Mertie Haslem, RN, RD, CDE

## 2012-04-24 NOTE — Patient Instructions (Signed)
Embarazo - Segundo trimestre (Pregnancy - Second Trimester) El segundo trimestre del embarazo (del 3 al 6mes) es un perodo de evolucin rpida para usted y el beb. Hacia el final del sexto mes, el beb mide aproximadamente 23 cm y pesa 680 g. Comenzar a sentir los movimientos del beb entre las 18 y las 20 semanas de embarazo. Podr sentir las pataditas ("quickening en ingls"). Hay un rpido aumento de peso. Puede segregar un lquido claro (calostro) de las mamas. Quizs sienta pequeas contracciones en el vientre (tero) Esto se conoce como falso trabajo de parto o contracciones de Braxton-Hicks. Es como una prctica del trabajo de parto que se produce cuando el beb est listo para salir. Generalmente los problemas de vmitos matinales ya se han superado hacia el final del primer trimestre. Algunas mujeres desarrollan pequeas manchas oscuras (que se denominan cloasma, mscara del embarazo) en la cara que normalmente se van luego del nacimiento del beb. La exposicin al sol empeora las manchas. Puede desarrollarse acn en algunas mujeres embarazadas, y puede desaparecer en aquellas que ya tienen acn. EXAMENES PRENATALES  Durante los exmenes prenatales, deber seguir realizando pruebas de sangre, segn avance el embarazo. Estas pruebas se realizan para controlar su salud y la del beb. Tambin se realizan anlisis de sangre para conocer los niveles de hemoglobina. La anemia (bajo nivel de hemoglobina) es frecuente durante el embarazo. Para prevenirla, se administran hierro y vitaminas. Tambin se le realizarn exmenes para saber si tiene diabetes entre las 24 y las 28 semanas del embarazo. Podrn repetirle algunas de las pruebas que le hicieron previamente.  En cada visita le medirn el tamao del tero. Esto se realiza para asegurarse de que el beb est creciendo correctamente de acuerdo al estado del embarazo.  Tambin en cada visita prenatal controlarn su presin arterial. Esto se realiza  para asegurarse de que no tenga toxemia.  Se controlar su orina para asegurarse de que no tenga infecciones, diabetes o protena en la orina.  Se controlar su peso regularmente para asegurarse que el aumento ocurre al ritmo indicado. Esto se hace para asegurarse que usted y el beb tienen una evolucin normal.  En algunas ocasiones se realiza una prueba de ultrasonido para confirmar el correcto desarrollo y evolucin del beb. Esta prueba se realiza con ondas sonoras inofensivas para el beb, de modo que el profesional pueda calcular ms precisamente la fecha del parto. Algunas veces se realizan pruebas especializadas del lquido amnitico que rodea al beb. Esta prueba se denomina amniocentesis. El lquido amnitico se obtiene introduciendo una aguja en el vientre (abdomen). Se realiza para controlar los cromosomas en aquellos casos en los que existe alguna preocupacin acerca de algn problema gentico que pueda sufrir el beb. En ocasiones se lleva a cabo cerca del final del embarazo, si es necesario inducir al parto. En este caso se realiza para asegurarse que los pulmones del beb estn lo suficientemente maduros como para que pueda vivir fuera del tero. CAMBIOS QUE OCURREN EN EL SEGUNDO TRIMESTRE DEL EMBARAZO Su organismo atravesar numerosos cambios durante el embarazo. Estos pueden variar de una persona a otra. Converse con el profesional que la asiste acerca los cambios que usted note y que la preocupen.  Durante el segundo trimestre probablemente sienta un aumento del apetito. Es normal tener "antojos" de ciertas comidas. Esto vara de una persona a otra y de un embarazo a otro.  El abdomen inferior comenzar a abultarse.  Podr tener la necesidad de orinar con ms frecuencia debido a que   el tero y el beb presionan sobre la vejiga. Tambin es frecuente contraer ms infecciones urinarias durante el embarazo (dolor al orinar). Puede evitarlas bebiendo gran cantidad de lquidos y vaciando  la vejiga antes y despus de mantener relaciones sexuales.  Podrn aparecer las primeras estras en las caderas, abdomen y mamas. Estos son cambios normales del cuerpo durante el embarazo. No existen medicamentos ni ejercicios que puedan prevenir estos cambios.  Es posible que comience a desarrollar venas inflamadas y abultadas (varices) en las piernas. El uso de medias de descanso, elevar sus pies durante 15 minutos, 3 a 4 veces al da y limitar la sal en su dieta ayuda a aliviar el problema.  Podr sentir acidez gstrica a medida que el tero crece y presiona contra el estmago. Puede tomar anticidos, con la autorizacin de su mdico, para aliviar este problema. Tambin es til ingerir pequeas comidas 4 a 5 veces al da.  La constipacin puede tratarse con un laxante o agregando fibra a su dieta. Beber grandes cantidades de lquidos, comer vegetales, frutas y granos integrales es de gran ayuda.  Tambin es beneficioso practicar actividad fsica. Si ha sido una persona activa hasta el embarazo, podr continuar con la mayora de las actividades durante el mismo. Si ha sido menos activa, puede ser beneficioso que comience con un programa de ejercicios, como realizar caminatas.  Puede desarrollar hemorroides (vrices en el recto) hacia el final del segundo trimestre. Tomar baos de asiento tibios y utilizar cremas recomendadas por el profesional que lo asiste sern de ayuda para los problemas de hemorroides.  Tambin podr sentir dolor de espalda durante este momento de su embarazo. Evite levantar objetos pesados, utilice zapatos de taco bajo y mantenga una buena postura para ayudar a reducir los problemas de espalda.  Algunas mujeres embarazadas desarrollan hormigueo y adormecimiento de la mano y los dedos debido a la hinchazn y compresin de los ligamentos de la mueca (sndrome del tnel carpiano). Esto desaparece una vez que el beb nace.  Como sus pechos se agrandan, necesitar un sujetador  ms grande. Use un sostn de soporte, cmodo y de algodn. No utilice un sostn para amamantar hasta el ltimo mes de embarazo si va a amamantar al beb.  Podr observar una lnea oscura desde el ombligo hacia la zona pbica denominada linea nigra.  Podr observar que sus mejillas se ponen coloradas debido al aumento de flujo sanguneo en la cara.  Podr desarrollar "araitas" en la cara, cuello y pecho. Esto desaparece una vez que el beb nace. INSTRUCCIONES PARA EL CUIDADO DOMICILIARIO  Es extremadamente importante que evite el cigarrillo, hierbas medicinales, alcohol y las drogas no prescriptas durante el embarazo. Estas sustancias qumicas afectan la formacin y el desarrollo del beb. Evite estas sustancias durante todo el embarazo para asegurar el nacimiento de un beb sano.  La mayor parte de los cuidados que se aconsejan son los mismos que los indicados para el primer trimestre del embarazo. Cumpla con las citas tal como se le indic. Siga las instrucciones del profesional que lo asiste con respecto al uso de los medicamentos, el ejercicio y la dieta.  Durante el embarazo debe obtener nutrientes para usted y para su beb. Consuma alimentos balanceados a intervalos regulares. Elija alimentos como carne, pescado, leche y otros productos lcteos descremados, vegetales, frutas, panes integrales y cereales. El profesional le informar cul es el aumento de peso ideal.  Las relaciones sexuales fsicas pueden continuarse hasta cerca del fin del embarazo si no existen otros problemas. Estos   problemas pueden ser la prdida temprana (prematura) de lquido amnitico de las membranas, sangrado vaginal, dolor abdominal u otros problemas mdicos o del embarazo.  Realice actividad fsica todos los das, si no tiene restricciones. Consulte con el profesional que la asiste si no sabe con certeza si determinados ejercicios son seguros. El mayor aumento de peso tiene lugar durante los ltimos 2 trimestres del  embarazo. El ejercicio la ayudar a:  Controlar su peso.  Ponerla en forma para el parto.  Ayudarla a perder peso luego de haber dado a luz.  Use un buen sostn o como los que se usan para hacer deportes para aliviar la sensibilidad de las mamas. Tambin puede serle til si lo usa mientras duerme. Si pierde calostro, podr utilizar apsitos en el sostn.  No utilice la baera con agua caliente, baos turcos y saunas durante el embarazo.  Utilice el cinturn de seguridad sin excepcin cuando conduzca. Este la proteger a usted y al beb en caso de accidente.  Evite comer carne cruda, queso crudo, y el contacto con los utensilios y desperdicios de los gatos. Estos elementos contienen grmenes que pueden causar defectos de nacimiento en el beb.  El segundo trimestre es un buen momento para visitar a su dentista y evaluar su salud dental si an no lo ha hecho. Es importante mantener los dientes limpios. Utilice un cepillo de dientes blando. Cepllese ms suavemente durante el embarazo.  Es ms fcil perder algo de orina durante el embarazo. Apretar y fortalecer los msculos de la pelvis la ayudar con este problema. Practique detener la miccin cuando est en el bao. Estos son los mismos msculos que necesita fortalecer. Son tambin los mismos msculos que utiliza cuando trata de evitar los gases. Puede practicar apretando estos msculos 10 veces, y repetir esto 3 veces por da aproximadamente. Una vez que conozca qu msculos debe apretar, no realice estos ejercicios durante la miccin. Puede favorecerle una infeccin si la orina vuelve hacia atrs.  Pida ayuda si tiene necesidades econmicas, de asesoramiento o nutricionales durante el embarazo. El profesional podr ayudarla con respecto a estas necesidades, o derivarla a otros especialistas.  La piel puede ponerse grasa. Si esto sucede, lvese la cara con un jabn suave, utilice un humectante no graso y maquillaje con base de aceite o  crema. CONSUMO DE MEDICAMENTOS Y DROGAS DURANTE EL EMBARAZO  Contine tomando las vitaminas apropiadas para esta etapa tal como se le indic. Las vitaminas deben contener un miligramo de cido flico y deben suplementarse con hierro. Guarde todas las vitaminas fuera del alcance de los nios. La ingestin de slo un par de vitaminas o tabletas que contengan hierro puede ocasionar la muerte en un beb o en un nio pequeo.  Evite el uso de medicamentos, inclusive los de venta libre y hierbas que no hayan sido prescriptos o indicados por el profesional que la asiste. Algunos medicamentos pueden causar problemas fsicos al beb. Utilice los medicamentos de venta libre o de prescripcin para el dolor, el malestar o la fiebre, segn se lo indique el profesional que lo asiste. No utilice aspirina.  El consumo de alcohol est relacionado con ciertos defectos de nacimiento. Esto incluye el sndrome de alcoholismo fetal. Debe evitar el consumo de alcohol en cualquiera de sus formas. El cigarrillo causa nacimientos prematuros y bebs de bajo peso. El uso de drogas recreativas est absolutamente prohibido. Son muy nocivas para el beb. Un beb que nace de una madre adicta, ser adicto al nacer. Ese beb tendr los mismos   sntomas de abstinencia que un adulto.  Infrmele al profesional si consume alguna droga.  No consuma drogas ilegales. Pueden causarle mucho dao al beb. SOLICITE ATENCIN MDICA SI: Tiene preguntas o preocupaciones durante su embarazo. Es mejor que llame para consultar las dudas que esperar hasta su prxima visita prenatal. De esta forma se sentir ms tranquila.  SOLICITE ATENCIN MDICA DE INMEDIATO SI:  La temperatura oral se eleva sin motivo por encima de 102 F (38.9 C) o segn le indique el profesional que lo asiste.  Tiene una prdida de lquido por la vagina (canal de parto). Si sospecha una ruptura de las membranas, tmese la temperatura y llame al profesional para informarlo sobre  esto.  Observa unas pequeas manchas, una hemorragia vaginal o elimina cogulos. Notifique al profesional acerca de la cantidad y de cuntos apsitos est utilizando. Unas pequeas manchas de sangre son algo comn durante el embarazo, especialmente despus de mantener relaciones sexuales.  Presenta un olor desagradable en la secrecin vaginal y observa un cambio en el color, de transparente a blanco.  Contina con las nuseas y no obtiene alivio de los remedios indicados. Vomita sangre o algo similar a la borra del caf.  [place] o sube ms de 900 g. en una semana, o segn lo indicado por el profesional que la asiste.  Observa que se le hinchan el rostro, las manos, los pies o las piernas.  Ha estado expuesta a la rubola y no ha sufrido la enfermedad.  Ha estado expuesta a la quinta enfermedad o a la varicela.  Presenta dolor abdominal. Las molestias en el ligamento redondo son una causa no cancerosa (benigna) frecuente de dolor abdominal durante el embarazo. El profesional que la asiste deber evaluarla.  Presenta dolor de cabeza intenso que no se alivia.  Presenta fiebre, diarrea, dolor al orinar o le falta la respiracin.  Presenta dificultad para ver, visin borrosa, o visin doble.  Sufre una cada, un accidente de trnsito o cualquier tipo de trauma.  Vive en un hogar en el que existe violencia fsica o mental. Document Released: 01/27/2005 Document Revised: 07/12/2011 Exit[employer] Patient Information 2013 [employer].  

## 2012-04-24 NOTE — Progress Notes (Signed)
Fetal Echo completed on 04/20/12 and results will be faxed on Friday, May 01, 2012 when Dr. Elizebeth Brooking is back in the [place] office.

## 2012-04-24 NOTE — Progress Notes (Signed)
Just picked up glyburide yesterday, not started yet. Fs 96-125, PPs 86-148. Maggie reviewed. Fetal Echo done: result to be faxed to [place] today.

## 2012-05-03 NOTE — L&D Delivery Note (Signed)
Pt seen and examined.  Agree with above.

## 2012-05-03 NOTE — L&D Delivery Note (Signed)
[name] is a 37 y.o. [id number] presenting at [redacted]w[redacted]d for IOL for nonreassuring fetal heart tracing and A2/B DM (poorly controlled).  Delivery Note At 8:14 AM a viable female was delivered via Vaginal, Spontaneous Delivery (Presentation: Left Occiput Anterior).  APGAR: 8, 9; weight 8 lb 2.2 oz (3690 g).   Placenta status: Intact, Spontaneous.  Cord: 3 vessels with the following complications: None.  Cord pH: n/a  Anesthesia: None  Episiotomy: None Lacerations: 1st degree Suture Repair: 3.0 vicryl Est. Blood Loss (mL): 250  Mom to postpartum.  Baby to nursery-stable.  Napoleon Form 08/15/2012, 10:20 AM

## 2012-05-08 ENCOUNTER — Encounter: Payer: Self-pay | Admitting: Obstetrics and Gynecology

## 2012-05-08 ENCOUNTER — Ambulatory Visit (INDEPENDENT_AMBULATORY_CARE_PROVIDER_SITE_OTHER): Payer: Self-pay | Admitting: Obstetrics and Gynecology

## 2012-05-08 VITALS — BP 113/71 | Temp 97.0°F | Wt 122.8 lb

## 2012-05-08 DIAGNOSIS — O99810 Abnormal glucose complicating pregnancy: Secondary | ICD-10-CM

## 2012-05-08 LAB — POCT URINALYSIS DIP (DEVICE)
Glucose, UA: NEGATIVE mg/dL
Hgb urine dipstick: NEGATIVE
Ketones, ur: NEGATIVE mg/dL
Specific Gravity, Urine: 1.015 (ref 1.005–1.030)

## 2012-05-08 NOTE — Progress Notes (Signed)
Pulse: 90

## 2012-05-08 NOTE — Progress Notes (Signed)
CBG 69-87 2hr pp 67-120. Patient doing well without any complaints. FM/PTL precautions reviewed with patient. Continue glyburide. Will try to obtain fetal echo results.

## 2012-05-10 ENCOUNTER — Encounter: Payer: Self-pay | Admitting: *Deleted

## 2012-05-10 ENCOUNTER — Encounter: Payer: Self-pay | Admitting: Family Medicine

## 2012-05-22 ENCOUNTER — Ambulatory Visit (INDEPENDENT_AMBULATORY_CARE_PROVIDER_SITE_OTHER): Payer: Self-pay | Admitting: Obstetrics and Gynecology

## 2012-05-22 VITALS — BP 102/74 | Temp 97.5°F | Wt 125.3 lb

## 2012-05-22 DIAGNOSIS — O24419 Gestational diabetes mellitus in pregnancy, unspecified control: Secondary | ICD-10-CM

## 2012-05-22 DIAGNOSIS — O99810 Abnormal glucose complicating pregnancy: Secondary | ICD-10-CM

## 2012-05-22 LAB — POCT URINALYSIS DIP (DEVICE)
Bilirubin Urine: NEGATIVE
Glucose, UA: 100 mg/dL — AB
Hgb urine dipstick: NEGATIVE
Nitrite: NEGATIVE
Specific Gravity, Urine: 1.015 (ref 1.005–1.030)
Urobilinogen, UA: 0.2 mg/dL (ref 0.0–1.0)

## 2012-05-22 NOTE — Progress Notes (Signed)
p=95 

## 2012-05-22 NOTE — Patient Instructions (Signed)
Gua de planeamiento de la alimentacin para diabticos (Diabetes Meal Planning Guide) La gua de planeamiento de alimentacin para diabticos es una herramienta para ayudarlo a planear sus comidas y colaciones. Es importante para las personas con diabetes controlar sus niveles de Location manager. Elegir los [employer] correctos y las cantidades adecuadas durante el da le ayudar a Media planner. Comer bien puede incluso ayudarlo a mejorar la presin sangunea y Science writer o Theatre manager un peso saludable. CUENTE LOS HIDRATOS DE CARBONO CON FACILIDAD Cuando consume hidratos de carbono, stos se transforman en azcar (glucosa). Esto a su vez Agricultural consultant de Museum/gallery exhibitions officer. El conteo de carbohidratos puede ayudarlo a Chief Technology Officer este nivel para que se sienta mejor. Al planear sus alimentos con el conteo de carbohidratos, podr tener ms flexibilidad en lo que come y Curator con el consumo de alimentos. El conteo de carbohidratos significa simplemente sumar la cantidad total de gramos de carbohidratos a sus comidas o colaciones. Trate de consumir la misma cantidad en cada comida. A continuacin encontrar una lista de 1 porcin o 15 gr. de carbohidratos. A continuacin se enumeran. Pregunte al mdico cuntos gramos de carbohidratos necesita comer en cada comida o colacin. Almidones y granos  1 [place] de pan.   bollo ingls o bollo para hamburguesa o hotdog.   taza de cereal fro (sin azcar).   taza de pasta o arroz cocido.   taza de vegetales que contengan almidn (maz, papas, arvejas, porotos, calabaza).  1 omelette (6 pulgadas).   bollo.  1 waffle o panqueque (del tamao de un CD).   taza de cereales cocidos.  4 a 6 galletas saldas pequeas. *Se recomienda el consumo de granos enteros. Frutas  1 taza de frutos rojos, meln, papaya o anan sin azcar.  1 fruta fresca pequea.   banana o mango.   taza de jugo de frutas (4 onzas sin endulzar).    taza de fruta envasada en jugo natural o agua.  2 cucharadas de frutas secas.  12-15 uvas o cerezas. Leche y yogurt  1 taza de [employer] o al 1%.  [place].  6 onzas de yogurt descremado con edulcorante sin azcar.  6 onzas de yogur descremado de soja.  6 onzas de yogur natural. Vegetales  1 taza de vegetales crudos o  de vegetales cocidos se considera cero carbohidratos o una comida "libre".  Si come 3 o ms porciones en una comida, cuntelas como 1 porcin de carbohidratos. Otros carbohidratos   onzas de chips o pretzels.   taza de helado de crema o yogur helado.   taza de helado de agua.  5 cm de torta no congelada.  1 cucharada de miel, azcar, mermelada, jalea o almbar.  2 galletitas dulces pequeas.  3 cuadrados de crackers de graham.  3 tazas de palomitas de maz.  6 crackers.  1 taza de caldo.  Cuente 1 taza de guisado u otra mezcla de alimentos como 2 porciones de carbohidratos.  Los alimentos con menos de 20 caloras por porcin deben contarse como cero carbohidratos o alimento "libre". Si lo desea compre un libro o software de computacin que enumere la cantidad de gramos de carbohidratos de los diferentes alimentos. Adems, el panel nutricional en las etiquetas de los productos que consume es una buena fuente de informacin. Le indicar el tamao de la porcin y la cantidad total de carbohidratos que consumir por cada una. Divida este nmero por 15 para obtener el nmero  de conteo de carbohidratos por porcin. Recuerde: cada porcin son 33 gramos de carbohidratos. PORCIONES La medicin de los alimentos y el tamao de las porciones lo ayudarn a Scientist, physiological cantidad exacta de comida que debe ingerir. La lista que sigue le mostrar el tamao de algunas porciones comunes.   1 onza.................4 dados apilados.  3 onzas..............Marland KitchenUn mazo de cartas.  1 cucharadita...Marland KitchenMarland KitchenLa punta de un dedo pequeo.  1  cucharada.......Marland KitchenUn dedo.  2 cucharadas....Marland KitchenMarland KitchenUna pelota de golf.   taza..............Marland KitchenLa mitad de un puo.  1 taza...............Marland KitchenUn puo. EJEMPLO DE PLAN DE ALIMENTACIN PARA DIABTICOS: A continuacin se muestra un ejemplo de plan de alimentacin que incluye comidas de los grupos de granos y Mikes, Sports administrator, frutas y carnes. Un nutricionista podr confeccionarle un plan individualizado para cubrir sus necesidades calricas y decirle el nmero de porciones que necesita de [place]. Sin embargo, podra [employer] alimentos que contengan carbohidratos (lcteos, cereales y frutas). Controlar la cantidad total de carbohidratos en los alimentos o colaciones es ms importante que asegurarse de incluir todos los grupos alimenticios cada vez que come.  El siguiente plan de alimentacin es un ejemplo de una dieta de 2000 caloras mediante el conteo de carbohidratos. Este plan contiene 17 porciones de carbohidratos. Desayuno  1 taza de avena (2 porciones de carbohidratos).   taza de yogur light(1 porcin de carbohidratos).  1 taza de arndanos (1 porcin de carbohidratos).   taza de almendras. Colacin  1 manzana grande (2 porciones de carbohidratos).  1 palito de queso bajo [employer]. Almuerzo  Ensalada de pechuga de pollo.  1 taza de espinacas.   taza de tomates cortados.  2 oz (60 gr) de pechuga de pollo en rebanadas.  2 cucharadas de aderezo italiano bajo en [employer].  12 galletas integrales (2 porciones de carbohidratos).  12 a 15 uvas (1 porcin de carbohidratos).  1 taza de [employer] (1porcin de carbohidratos). Colacin  1 taza de zanahorias.   taza de pur de garbanzos (1 porcin de carbohidratos). Cena  3 oz (80 gr) de salmn a la parrilla.  1 taza de arroz integral (3 porciones de carbohidratos). Colacin  1  taza de brcoli al vapor (1 porcin de carbohidrato) con una cucharadita de aceite de oliva y jugo de limn.  1 taza de  budn light (2 porciones de carbohidratos). HOJA DE PLANEAMIENTO DE LA ALIMENTACIN: El dietista podr utilizar esta hoja para ayudarlo a decidir cuntas porciones y qu tipos de alimentos son los adecuados para usted.  DESAYUNO Grupo de alimentos y porciones / Alimento elegido Granos/Fculas_________________________________________________ Lcteos________________________________________________________ Rufina Falco ______________________________________________________ Lou Miner __________________________________________________________ Charlesetta Ivory _________________________________________________________ Rosalin Hawking _________________________________________________________ Lorin Mercy de alimentos y porciones / Alimento elegido Granos/Fculas___________________________________________________ Lcteos_________________________________________________________ Lou Miner ___________________________________________________________ Charlesetta Ivory __________________________________________________________ Rosalin Hawking __________________________________________________________ [place] de alimentos y porciones / Alimento elegido Granos/Fculas___________________________________________________ Lcteos_________________________________________________________ Lou Miner ___________________________________________________________ Charlesetta Ivory __________________________________________________________ Rosalin Hawking __________________________________________________________ Jettie Pagan de alimentos y porciones / Alimento elegido Granos/Fculas_________________________________________________ Lcteos________________________________________________________ Rufina Falco ______________________________________________________ Lou Miner _________________________________________________________ Charlesetta Ivory ________________________________________________________ Rosalin Hawking ________________________________________________________ Carolyn Stare  DIARIOS Fculas_______________________________________________________ Vegetales _____________________________________________________ Lou Miner ________________________________________________________ Lcteos_______________________________________________________ Carnes________________________________________________________ Rosalin Hawking ________________________________________________________ Document Released: 07/27/2007 Document Revised: 07/12/2011 ExitCare Patient Information 2013 [employer].

## 2012-05-22 NOTE — Progress Notes (Signed)
Anat [place] all WNL. Doing well. CBGs within range x pcd 188 once when she forgot to take glyburide; also some borderline pc highs in 120s. Reviewed diet, exercise, hs snack.

## 2012-06-05 ENCOUNTER — Encounter: Payer: Self-pay | Admitting: Obstetrics and Gynecology

## 2012-07-17 ENCOUNTER — Encounter: Payer: Self-pay | Admitting: *Deleted

## 2012-07-17 ENCOUNTER — Ambulatory Visit (INDEPENDENT_AMBULATORY_CARE_PROVIDER_SITE_OTHER): Payer: Self-pay | Admitting: Obstetrics & Gynecology

## 2012-07-17 ENCOUNTER — Other Ambulatory Visit: Payer: Self-pay | Admitting: Obstetrics & Gynecology

## 2012-07-17 VITALS — BP 106/68 | Temp 97.7°F | Wt 128.1 lb

## 2012-07-17 DIAGNOSIS — O0993 Supervision of high risk pregnancy, unspecified, third trimester: Secondary | ICD-10-CM

## 2012-07-17 DIAGNOSIS — O99810 Abnormal glucose complicating pregnancy: Secondary | ICD-10-CM

## 2012-07-17 LAB — POCT URINALYSIS DIP (DEVICE)
Bilirubin Urine: NEGATIVE
Glucose, UA: NEGATIVE mg/dL
Leukocytes, UA: NEGATIVE
Nitrite: NEGATIVE
Urobilinogen, UA: 1 mg/dL (ref 0.0–1.0)

## 2012-07-17 MED ORDER — TETANUS-DIPHTH-ACELL PERTUSSIS 5-2.5-18.5 LF-MCG/0.5 IM SUSP
0.5000 mL | Freq: Once | INTRAMUSCULAR | Status: AC
  Administered 2012-07-17: 0.5 mL via INTRAMUSCULAR

## 2012-07-17 NOTE — Patient Instructions (Signed)
Diabetes mellitus gestacional (Gestational Diabetes Mellitus) La diabetes mellitus gestacional se produce slo durante el embarazo. Aparece cuando el organismo no puede controlar adecuadamente la glucosa (azcar) que aumenta en la sangre despus de comer. Durante el embarazo, se produce una resistencia a la insulina (sensibilidad reducida a la insulina) debido a la liberacin de hormonas por parte de la placenta. Generalmente, el pncreas de una mujer embarazada produce la cantidad suficiente de insulina para vencer esa resistencia. Sin embargo, en la diabetes gestacional, hay insulina pero no cumple su funcin adecuadamente. Si la resistencia es lo suficientemente grave como para que el pncreas no produzca la cantidad de insulina suficiente, la glucosa extra se acumula en la sangre.  QUINES TIENEN RIESGO DE DESARROLLAR DIABETES GESTACIONAL?  Las mujeres con historia de diabetes en la familia.  Las mujeres de ms de 25 aos.  Las que presentan sobrepeso.  Las mujeres que pertenecen a ciertos grupos tnicos (latinas, afroamericanas, norteamericanas nativas, asiticas y las originarias de las islas del [employer]. QUE PUEDE OCURRIRLE AL BEB? Si el nivel de glucosa en sangre de la madre es demasiado elevado mientras este embarazada, el nivel extra de azcar pasar por el cordn umbilical hacia el beb. Algunos de los problemas del beb pueden ser:  Beb demasiado grande: si el nio recibe demasiada azcar, puede aumentar mucho de peso. Esto puede hacer que sea demasiado grande para nacer por parto normal (vaginal) por lo que ser necesario realizar una cesrea.  Bajo nivel de glucosa (hipoglucemia): el beb produce insulina extra en respuesta a la excesiva cantidad de azcar que obtiene de la madre. Cuando el beb nace y ya no necesita insulina extra, su nivel de azcar en sangre puede disminuir.  Ictericia (coloracin amarillenta de la piel y los ojos): esto es bastante frecuente en los bebs. La  causa es la acumulacin de una sustancia qumica denominada bilirrubina. No siempre es un trastorno grave, pero se observa con frecuencia en los bebs cuyas madres sufren diabetes gestacional. RIESGOS PARA LA MADRE Las mujeres que han sufrido diabetes gestacional pueden tener ms riesgos para algunos problemas como:  Preeclampsia o toxemia, incluyendo problemas con hipertensin arterial. La presin arterial y los niveles de protenas en la orina deben controlarse con frecuencia.  Infecciones  Parto por cesrea.  Aparicin de diabetes tipo 2 en una etapa posterior de la vida. Alrededor del 30% al 50% sufrir diabetes posteriormente, especialmente las que son obesas. DIAGNSTICO Las hormonas que causan resistencia a la insulina tienen su mayor nivel alrededor de las 24 a 28 semanas del embarazo. Si se experimentan sntomas, stos son similares a los sntomas que normalmente aparecen durante el embarazo.  La diabetes mellitus gestacional generalmente se diagnostica por medio de un mtodo en dos partes: 1. Despus de la 24 a 28 semanas de embarazo, la mujer debe beber una solucin que contiene glucosa y realizar un anlisis de sangre. Si el nivel de glucosa es elevado, la realizarn un segundo anlisis. 2. La prueba oral de tolerancia a la glucosa, que dura aproximadamente tres horas. Despus de realizar ayuno durante la noche, se controla nivel de glucosa en sangre. La mujer bebe una solucin que contiene glucosa y le realizan anlisis de glucosa en sangre cada hora. Si la mujer tiene factores de riesgos para la diabetes mellitus gestacional, el mdico podr indicar el anlisis antes de las 24 semanas de embarazo. TRATAMIENTO El tratamiento est dirigido a mantener la glucosa en sangre de la madre en un nivel normal y puede incluir:  La   planificacin de los alimentos.  Recibir insulina u otro medicamento para controlar el nivel de glucosa en sangre.  La prctica de ejercicios.  Llevar un  registro diario de los alimentos que consume.  Control y registro de los niveles de glucosa en sangre.  Control de los niveles de cetona en la orina, aunque esto ya no se considera necesario en la mayora de los embarazos. INSTRUCCIONES PARA EL CUIDADO DOMICILIARIO Mientras est embarazada:  Siga los consejos de su mdico relacionados con los controles prenatales, la planificacin de la comida, la actividad fsica, los medicamentos, vitaminas, los anlisis de sangre y otras pruebas y las actividades fsicas.  Lleve un registro de las comidas, las pruebas de glucosa en sangre y la cantidad de insulina que recibe (si corresponde). Muestre todo al profesional en cada consulta mdica prenatal.  Si sufre diabetes mellitus gestacional, podr tener problemas de hipoglucemia (nivel bajo de glucosa en sangre). Podr sospechar este problema si se siente repentinamente mareada, tiene temblores y/o se siente dbil. Si cree que esto le est ocurriendo, y tiene un medidor de glucosa, mida su nivel de glucosa en sangre. Siga los consejos de su mdico sobre el modo y el momento de tratar su nivel de glucosa en sangre. Generalmente se sigue la regla 15:15 Consuma 15 g de hidratos de carbono, espere 15 minutos y vuelva controlar el nivel de glucosa en sangre.. Ejemplos de 15 g de hidratos de carbono son:  1 taza de leche descremada.   taza de jugo.  3-4 tabletas de glucosa.  5-6 caramelos duros.  1 caja pequea de pasas de uva.   taza de gaseosa comn.  Mantenga una buena higiene para evitar infecciones.  No fume. SOLICITE ATENCIN MDICA SI:  Observa prdida vaginal con o sin picazn.  Se siente ms dbil o cansada que lo habitual.  Transpira mucho.  Tiene un aumento de peso repentino, 2,5 kg o ms en una semana.  Pierde peso, 1.5 kg o ms en una semana.  Su nivel de glucosa en sangre es elevado, necesita instrucciones. SOLICITE ATENCIN MDICA DE INMEDIATO SI:  Sufre una cefalea  intensa.  Se marea o pierde el conocimiento  Presenta nuseas o vmitos.  Se siente desorientada confundida.  Sufre convulsiones.  Tiene problemas de visin.  Siente dolor en el estmago.  Presenta una hemorragia vaginal abundante.  Tiene contracciones uterinas.  Tiene una prdida importante de lquido por la vagina DESPUS QUE NACE EL BEB:  Concurra a todos los controles de seguimiento y realice los anlisis de sangre segn las indicaciones de su mdico.  Mantenga un estilo de vida saludable para evitar la diabetes en el futuro. Aqu se incluye:  Siga el plan de alimentacin saludable.  Controle su peso.  Practique actividad fsica y descanse lo necesario.  No fume.  Amamante a su beb mientras pueda. Esto disminuir la probabilidad de que usted y su beb sufran diabetes posteriormente. Para ms informacin acerca de la diabetes, visite la pgina web de la American Diabetes Association: www.americandiabetesassociation.org. Para ms informacin acerca de la diabetes gestacional cite la pgina web del American Congress of Obstetricians and Gynecologists en: www.acog.org. Document Released: 01/27/2005 Document Revised: 07/12/2011 ExitCare Patient Information 2013 [employer].  

## 2012-07-17 NOTE — Progress Notes (Signed)
No record of BS today but states control is good.

## 2012-07-17 NOTE — Progress Notes (Signed)
P=99 , c/o trace edema in hands and feet sometimes,none at present, c/o pelvic pressue that started yesterday, c/o irregular contractions almost every night when she is tired, [employer] technologist

## 2012-07-20 ENCOUNTER — Ambulatory Visit (INDEPENDENT_AMBULATORY_CARE_PROVIDER_SITE_OTHER): Payer: Self-pay | Admitting: *Deleted

## 2012-07-20 VITALS — BP 107/69 | Wt 128.1 lb

## 2012-07-20 DIAGNOSIS — O99810 Abnormal glucose complicating pregnancy: Secondary | ICD-10-CM

## 2012-07-20 NOTE — Progress Notes (Signed)
P = 94 

## 2012-07-24 ENCOUNTER — Other Ambulatory Visit: Payer: Self-pay | Admitting: Family Medicine

## 2012-07-24 ENCOUNTER — Ambulatory Visit (HOSPITAL_COMMUNITY)
Admission: RE | Admit: 2012-07-24 | Discharge: 2012-07-24 | Disposition: A | Discharge status: Home / Self Care | Payer: Self-pay | Source: Ambulatory Visit | Attending: Obstetrics & Gynecology | Admitting: Obstetrics & Gynecology

## 2012-07-24 ENCOUNTER — Encounter: Payer: Self-pay | Admitting: Obstetrics and Gynecology

## 2012-07-24 ENCOUNTER — Ambulatory Visit (INDEPENDENT_AMBULATORY_CARE_PROVIDER_SITE_OTHER): Payer: Self-pay | Admitting: Obstetrics and Gynecology

## 2012-07-24 ENCOUNTER — Ambulatory Visit (HOSPITAL_COMMUNITY): Payer: Self-pay

## 2012-07-24 VITALS — BP 106/72 | Temp 97.7°F | Wt 125.0 lb

## 2012-07-24 DIAGNOSIS — O09529 Supervision of elderly multigravida, unspecified trimester: Secondary | ICD-10-CM | POA: Insufficient documentation

## 2012-07-24 DIAGNOSIS — O99810 Abnormal glucose complicating pregnancy: Secondary | ICD-10-CM

## 2012-07-24 DIAGNOSIS — O0993 Supervision of high risk pregnancy, unspecified, third trimester: Secondary | ICD-10-CM

## 2012-07-24 DIAGNOSIS — Z3689 Encounter for other specified antenatal screening: Secondary | ICD-10-CM | POA: Insufficient documentation

## 2012-07-24 NOTE — Progress Notes (Signed)
Pulse- 80 

## 2012-07-24 NOTE — Progress Notes (Signed)
NST reviewed and reactive. CBGs all within range. FM/PTL precautions reviewed

## 2012-07-24 NOTE — Progress Notes (Signed)
[place] growth done today

## 2012-07-25 NOTE — Progress Notes (Signed)
NST 07-20-12 reviewed and reactive

## 2012-07-27 ENCOUNTER — Ambulatory Visit (INDEPENDENT_AMBULATORY_CARE_PROVIDER_SITE_OTHER): Payer: Self-pay | Admitting: *Deleted

## 2012-07-27 VITALS — BP 111/71 | Wt 125.0 lb

## 2012-07-27 DIAGNOSIS — O99810 Abnormal glucose complicating pregnancy: Secondary | ICD-10-CM

## 2012-07-27 NOTE — Progress Notes (Signed)
P-85 

## 2012-07-31 ENCOUNTER — Ambulatory Visit (INDEPENDENT_AMBULATORY_CARE_PROVIDER_SITE_OTHER): Payer: Self-pay | Admitting: Obstetrics and Gynecology

## 2012-07-31 VITALS — BP 127/88 | Temp 97.0°F | Wt 128.1 lb

## 2012-07-31 DIAGNOSIS — O0993 Supervision of high risk pregnancy, unspecified, third trimester: Secondary | ICD-10-CM

## 2012-07-31 DIAGNOSIS — O99810 Abnormal glucose complicating pregnancy: Secondary | ICD-10-CM

## 2012-07-31 LAB — POCT URINALYSIS DIP (DEVICE)
Bilirubin Urine: NEGATIVE
Hgb urine dipstick: NEGATIVE
Ketones, ur: NEGATIVE mg/dL
Protein, ur: NEGATIVE mg/dL
pH: 6.5 (ref 5.0–8.0)

## 2012-07-31 LAB — OB RESULTS CONSOLE GC/CHLAMYDIA
Chlamydia: NEGATIVE
Gonorrhea: NEGATIVE

## 2012-07-31 NOTE — Progress Notes (Signed)
NST reviewed and reactive. Patient did not bring log book or meter today but reports CBGs well controlled. Cultures collected.FM/PTL precautions reviewed. Will schedule follow-up growth ultrasound

## 2012-07-31 NOTE — Progress Notes (Signed)
Pulse: 79

## 2012-08-01 ENCOUNTER — Telehealth: Payer: Self-pay | Admitting: *Deleted

## 2012-08-01 LAB — GC/CHLAMYDIA PROBE AMP
CT Probe RNA: NEGATIVE
GC Probe RNA: NEGATIVE

## 2012-08-01 NOTE — Telephone Encounter (Signed)
Called pt w/[employer] interpreter # [phone] Byrd Hesselbach). She left message for pt that she will not be needing her [place] as scheduled on 08/03/12 because she had one last week.  She will still need appt on 4/3 for NST.

## 2012-08-02 NOTE — Progress Notes (Signed)
3/27 NST reviewed and reactive 

## 2012-08-03 ENCOUNTER — Ambulatory Visit (HOSPITAL_COMMUNITY): Admission: RE | Admit: 2012-08-03 | Payer: Self-pay | Source: Ambulatory Visit

## 2012-08-03 ENCOUNTER — Ambulatory Visit (INDEPENDENT_AMBULATORY_CARE_PROVIDER_SITE_OTHER): Payer: Self-pay | Admitting: *Deleted

## 2012-08-03 ENCOUNTER — Encounter: Payer: Self-pay | Admitting: Obstetrics and Gynecology

## 2012-08-03 VITALS — BP 120/67 | Wt 128.1 lb

## 2012-08-03 DIAGNOSIS — O99810 Abnormal glucose complicating pregnancy: Secondary | ICD-10-CM

## 2012-08-03 LAB — CULTURE, BETA STREP (GROUP B ONLY)

## 2012-08-03 NOTE — Progress Notes (Signed)
P - 74 

## 2012-08-03 NOTE — Progress Notes (Signed)
NST reviewed and reactive.  Denise Casey, M.D., FACOG    

## 2012-08-07 ENCOUNTER — Ambulatory Visit (INDEPENDENT_AMBULATORY_CARE_PROVIDER_SITE_OTHER): Payer: Self-pay | Admitting: Obstetrics & Gynecology

## 2012-08-07 ENCOUNTER — Other Ambulatory Visit: Payer: Self-pay | Admitting: Family Medicine

## 2012-08-07 VITALS — BP 114/71 | Temp 97.0°F | Wt 130.5 lb

## 2012-08-07 DIAGNOSIS — O99810 Abnormal glucose complicating pregnancy: Secondary | ICD-10-CM

## 2012-08-07 NOTE — Progress Notes (Signed)
Pt reports decr FM x2 days.   Next growth [place] scheduled 4/14

## 2012-08-07 NOTE — Progress Notes (Signed)
All CBG are WNL.  CBG log is written in the same pen each pen--quite pristine.  CBG today (bread and coffee) --> 116.   Last growth at 35 weeks 69%.  Induce at 39 weeks.  Pt did not leave urine sample today.

## 2012-08-07 NOTE — Progress Notes (Signed)
P73=, C/o trace edema in feet, c/o pain in hips,

## 2012-08-07 NOTE — Progress Notes (Signed)
IOL scheduled 08/19/12 at 7 am. [facility] to fax HIV results from 03/08/12 nonreactive.

## 2012-08-08 ENCOUNTER — Telehealth (HOSPITAL_COMMUNITY): Payer: Self-pay | Admitting: *Deleted

## 2012-08-08 NOTE — Telephone Encounter (Signed)
Preadmission screen Interpreter number [phone]

## 2012-08-09 ENCOUNTER — Encounter (HOSPITAL_COMMUNITY): Payer: Self-pay | Admitting: *Deleted

## 2012-08-10 ENCOUNTER — Ambulatory Visit (INDEPENDENT_AMBULATORY_CARE_PROVIDER_SITE_OTHER): Payer: Self-pay | Admitting: *Deleted

## 2012-08-10 VITALS — BP 121/75

## 2012-08-10 DIAGNOSIS — O99810 Abnormal glucose complicating pregnancy: Secondary | ICD-10-CM

## 2012-08-10 NOTE — Progress Notes (Signed)
P = 110  Pt reports continued decrease in FM since 4/7- moves well in the morning but not much in the afternoon. She felt good FM during NST- kick counts encouraged

## 2012-08-10 NOTE — Progress Notes (Signed)
NST reactive 08/10/12

## 2012-08-13 ENCOUNTER — Encounter: Payer: Self-pay | Admitting: Obstetrics & Gynecology

## 2012-08-14 ENCOUNTER — Ambulatory Visit (HOSPITAL_COMMUNITY)
Admission: RE | Admit: 2012-08-14 | Discharge: 2012-08-14 | Disposition: A | Discharge status: Home / Self Care | Payer: Self-pay | Source: Ambulatory Visit | Attending: Obstetrics and Gynecology | Admitting: Obstetrics and Gynecology

## 2012-08-14 ENCOUNTER — Encounter: Payer: Self-pay | Admitting: Obstetrics and Gynecology

## 2012-08-14 ENCOUNTER — Inpatient Hospital Stay (HOSPITAL_COMMUNITY)
Admission: AD | Admit: 2012-08-14 | Discharge: 2012-08-16 | DRG: 775 | Disposition: A | Discharge status: Home / Self Care | Payer: Medicaid Other | Source: Ambulatory Visit | Attending: Obstetrics & Gynecology | Admitting: Obstetrics & Gynecology

## 2012-08-14 ENCOUNTER — Ambulatory Visit (HOSPITAL_COMMUNITY)
Admission: RE | Admit: 2012-08-14 | Discharge: 2012-08-14 | Disposition: A | Discharge status: Home / Self Care | Payer: Self-pay | Source: Ambulatory Visit | Attending: Obstetrics & Gynecology | Admitting: Obstetrics & Gynecology

## 2012-08-14 ENCOUNTER — Ambulatory Visit (INDEPENDENT_AMBULATORY_CARE_PROVIDER_SITE_OTHER): Payer: Self-pay | Admitting: Obstetrics and Gynecology

## 2012-08-14 ENCOUNTER — Encounter (HOSPITAL_COMMUNITY): Payer: Self-pay | Admitting: *Deleted

## 2012-08-14 ENCOUNTER — Telehealth: Payer: Self-pay | Admitting: Family Medicine

## 2012-08-14 VITALS — BP 121/79 | Temp 96.2°F | Wt 132.0 lb

## 2012-08-14 DIAGNOSIS — O0993 Supervision of high risk pregnancy, unspecified, third trimester: Secondary | ICD-10-CM

## 2012-08-14 DIAGNOSIS — Z3689 Encounter for other specified antenatal screening: Secondary | ICD-10-CM | POA: Insufficient documentation

## 2012-08-14 DIAGNOSIS — O99810 Abnormal glucose complicating pregnancy: Secondary | ICD-10-CM

## 2012-08-14 DIAGNOSIS — O368390 Maternal care for abnormalities of the fetal heart rate or rhythm, unspecified trimester, not applicable or unspecified: Secondary | ICD-10-CM

## 2012-08-14 DIAGNOSIS — O99814 Abnormal glucose complicating childbirth: Principal | ICD-10-CM | POA: Diagnosis present

## 2012-08-14 DIAGNOSIS — O09529 Supervision of elderly multigravida, unspecified trimester: Secondary | ICD-10-CM | POA: Diagnosis present

## 2012-08-14 LAB — POCT URINALYSIS DIP (DEVICE)
Bilirubin Urine: NEGATIVE
Glucose, UA: NEGATIVE mg/dL
Specific Gravity, Urine: 1.02 (ref 1.005–1.030)
Urobilinogen, UA: 0.2 mg/dL (ref 0.0–1.0)

## 2012-08-14 LAB — TYPE AND SCREEN: ABO/RH(D): A POS

## 2012-08-14 LAB — CBC
Hemoglobin: 11.9 g/dL — ABNORMAL LOW (ref 12.0–15.0)
MCHC: 34.4 g/dL (ref 30.0–36.0)

## 2012-08-14 LAB — RPR: RPR Ser Ql: NONREACTIVE

## 2012-08-14 LAB — ABO/RH: ABO/RH(D): A POS

## 2012-08-14 MED ORDER — FLEET ENEMA 7-19 GM/118ML RE ENEM: 1.0000 | ENEMA | Freq: Every day | RECTAL | Status: DC | PRN

## 2012-08-14 MED ORDER — OXYCODONE-ACETAMINOPHEN 5-325 MG PO TABS: 1.0000 | ORAL_TABLET | ORAL | Status: DC | PRN

## 2012-08-14 MED ORDER — TERBUTALINE SULFATE 1 MG/ML IJ SOLN: 0.2500 mg | Freq: Once | INTRAMUSCULAR | Status: AC | PRN

## 2012-08-14 MED ORDER — IBUPROFEN 600 MG PO TABS: 600.0000 mg | ORAL_TABLET | Freq: Four times a day (QID) | ORAL | Status: DC | PRN

## 2012-08-14 MED ORDER — LACTATED RINGERS IV SOLN
500.0000 mL | INTRAVENOUS | Status: DC | PRN
  Administered 2012-08-15 (×2): 500 mL via INTRAVENOUS

## 2012-08-14 MED ORDER — OXYTOCIN 40 UNITS IN LACTATED RINGERS INFUSION - SIMPLE MED
1.0000 m[IU]/min | INTRAVENOUS | Status: DC
  Administered 2012-08-14: 2 m[IU]/min via INTRAVENOUS
  Filled 2012-08-14: qty 1000

## 2012-08-14 MED ORDER — LIDOCAINE HCL (PF) 1 % IJ SOLN
30.0000 mL | INTRAMUSCULAR | Status: DC | PRN
  Administered 2012-08-15: 30 mL via SUBCUTANEOUS
  Filled 2012-08-14 (×2): qty 30

## 2012-08-14 MED ORDER — ACETAMINOPHEN 325 MG PO TABS: 650.0000 mg | ORAL_TABLET | ORAL | Status: DC | PRN

## 2012-08-14 MED ORDER — OXYTOCIN BOLUS FROM INFUSION: 500.0000 mL | INTRAVENOUS | Status: DC

## 2012-08-14 MED ORDER — LACTATED RINGERS IV SOLN
INTRAVENOUS | Status: DC
  Administered 2012-08-14 – 2012-08-15 (×3): via INTRAVENOUS

## 2012-08-14 MED ORDER — OXYTOCIN 40 UNITS IN LACTATED RINGERS INFUSION - SIMPLE MED
62.5000 mL/h | INTRAVENOUS | Status: DC
  Filled 2012-08-14: qty 1000

## 2012-08-14 MED ORDER — ONDANSETRON HCL 4 MG/2ML IJ SOLN: 4.0000 mg | Freq: Four times a day (QID) | INTRAMUSCULAR | Status: DC | PRN

## 2012-08-14 MED ORDER — CITRIC ACID-SODIUM CITRATE 334-500 MG/5ML PO SOLN: 30.0000 mL | ORAL | Status: DC | PRN

## 2012-08-14 NOTE — H&P (Signed)
Denise Casey is a 37 y.o. female presenting for IOL due to NR-NST and GDM.  Thre was minimal variability and 2 late appearing decelerations).   Maternal Medical History:  Contractions: Onset was 1-2 hours ago.   Frequency: irregular.   Perceived severity is mild.    Fetal activity: Perceived fetal activity is normal.   Last perceived fetal movement was within the past hour.    Prenatal Complications - Diabetes: gestational. Diabetes is managed by oral agent (monotherapy).        OB History   Grav Para Term Preterm Abortions TAB SAB Ect Mult Living   5 4 4  0 0 0 0 0 0 4     Past Medical History  Diagnosis Date  . Diabetes mellitus without complication   . Gestational diabetes     glyburide   Past Surgical History  Procedure Laterality Date  . No past surgeries     Family History: family history includes Diabetes in her brother, father, and mother and Heart attack in her father. Social History:  reports that she has never smoked. She has never used smokeless tobacco. She reports that she does not drink alcohol or use illicit drugs.    Review of Systems  Constitutional: Negative for fever.  Eyes: Negative for blurred vision and double vision.  Respiratory: Negative for shortness of breath.   Cardiovascular: Negative for chest pain, palpitations and leg swelling.  Gastrointestinal: Negative for heartburn, vomiting, abdominal pain, diarrhea and constipation.  Genitourinary: Negative for dysuria.  Skin: Negative for rash.  Neurological: Negative for loss of consciousness.  Psychiatric/Behavioral: Negative for substance abuse.      Blood pressure 119/85, pulse 73, temperature 98.5 F (36.9 C), temperature source Oral, resp. rate 20, height 4\' 6"  (1.372 m), weight 59.875 kg (132 lb), last menstrual period 11/20/2011. Maternal Exam:  Uterine Assessment: Contraction strength is mild.  Contraction frequency is irregular.   Abdomen: Fetal presentation:  vertex  Introitus: Normal vulva. Vulva is negative for lesion.  Normal vagina.  Vagina is negative for discharge.  Pelvis: adequate for delivery.   Cervix: Cervix evaluated by digital exam.     Fetal Exam Fetal State Assessment: Category I - tracings are normal.     Physical Exam  Constitutional: She is oriented to person, place, and time. She appears well-developed and well-nourished. No distress.  HENT:  Head: Normocephalic.  Eyes: EOM are normal.  Neck: Normal range of motion.  Cardiovascular: Normal rate.   Respiratory: Effort normal.  GI: Soft. There is no tenderness.  Gravid  Genitourinary: Vagina normal and uterus normal. Vulva exhibits no lesion. No vaginal discharge found.  2.5/60/-2  Musculoskeletal: Normal range of motion.  Neurological: She is alert and oriented to person, place, and time.  Skin: Skin is warm and dry. No rash noted.  Psychiatric: She has a normal mood and affect. Her behavior is normal.    Prenatal labs: ABO, Rh: A/Positive/-- (11/06 0000) Antibody: Negative (11/06 0000) Rubella: Equivocal (11/06 0000) RPR: Nonreactive (11/06 0000)  HBsAg:    HIV: Non-reactive (11/06 0000)  GBS: Negative (03/31 0000)   Assessment/Plan: 37yo [id number] at 38.2wks presenting for IOL due to NR-NST w/ h/o A2 GDM - Admit for IOL - Favorable for Pitocin - Pt desiring delivery w/o pain medication support - GBS neg.  - HBsAg pending - OCP for contraception - Breast feeding  MERRELL, DAVID, MD Family Medicine Resident PGY-2 08/14/2012, 5:09 PM  Seen also by me Agree with note Wynelle Bourgeois CNM

## 2012-08-14 NOTE — Progress Notes (Signed)
[place] for growth done today.  NST NR- report called to Dr. Penne Lash- pt sent back to [place] dept for BPP.

## 2012-08-14 NOTE — Progress Notes (Signed)
NST non reactive.  Was sent for BPP.  Considering induction.

## 2012-08-14 NOTE — Telephone Encounter (Deleted)
Attempted to contact again.  Voicemail only  Shelly Flatten, MD Family Medicine PGY-2 08/14/2012, 3:02 PM

## 2012-08-14 NOTE — Progress Notes (Signed)
Patient doing well without complaints. Did not bring log book or meter. Patient admits to not checking CBGs regularly as she is not eating well. Reviewed importance of regular meals and effects of prolonged fasting periods on CBGs. Labor precautions/FM discussed. Patient scheduled for IOL on 4/19 NST reviewed- non- reactive. Patient sent to [place] for BPP

## 2012-08-14 NOTE — Progress Notes (Signed)
Pulse 64  C/o pressure in vaginal area.

## 2012-08-14 NOTE — Telephone Encounter (Addendum)
Left a voicemail w/ pt requesting her to call back. Given resident call number. Need pt to come back into the MAU for evaluation  Recently seen in clinic w/ NR-NST and BPP of 8/8. Will call again.  Shelly Flatten, MD Family Medicine PGY-2 08/14/2012, 2:29 PM    Pt called back and discussed her case.  Pt to return to MAU.  Shelly Flatten, MD  Family Medicine PGY-2  08/14/2012, 3:07 PM

## 2012-08-14 NOTE — Telephone Encounter (Deleted)
Pt called back and discussed her case. Pt to return to MAU.  Shelly Flatten, MD Family Medicine PGY-2 08/14/2012, 3:07 PM

## 2012-08-15 ENCOUNTER — Encounter (HOSPITAL_COMMUNITY): Payer: Self-pay | Admitting: *Deleted

## 2012-08-15 DIAGNOSIS — O09529 Supervision of elderly multigravida, unspecified trimester: Secondary | ICD-10-CM

## 2012-08-15 DIAGNOSIS — O99814 Abnormal glucose complicating childbirth: Secondary | ICD-10-CM

## 2012-08-15 LAB — GLUCOSE, CAPILLARY: Glucose-Capillary: 141 mg/dL — ABNORMAL HIGH (ref 70–99)

## 2012-08-15 MED ORDER — PRENATAL MULTIVITAMIN CH
1.0000 | ORAL_TABLET | Freq: Every day | ORAL | Status: DC
  Administered 2012-08-15 – 2012-08-16 (×2): 1 via ORAL
  Filled 2012-08-15 (×2): qty 1

## 2012-08-15 MED ORDER — OXYCODONE-ACETAMINOPHEN 5-325 MG PO TABS: 1.0000 | ORAL_TABLET | ORAL | Status: DC | PRN

## 2012-08-15 MED ORDER — SIMETHICONE 80 MG PO CHEW: 80.0000 mg | CHEWABLE_TABLET | ORAL | Status: DC | PRN

## 2012-08-15 MED ORDER — ZOLPIDEM TARTRATE 5 MG PO TABS: 5.0000 mg | ORAL_TABLET | Freq: Every evening | ORAL | Status: DC | PRN

## 2012-08-15 MED ORDER — DIBUCAINE 1 % RE OINT: 1.0000 "application " | TOPICAL_OINTMENT | RECTAL | Status: DC | PRN

## 2012-08-15 MED ORDER — ONDANSETRON HCL 4 MG PO TABS: 4.0000 mg | ORAL_TABLET | ORAL | Status: DC | PRN

## 2012-08-15 MED ORDER — TETANUS-DIPHTH-ACELL PERTUSSIS 5-2.5-18.5 LF-MCG/0.5 IM SUSP: 0.5000 mL | Freq: Once | INTRAMUSCULAR | Status: DC

## 2012-08-15 MED ORDER — WITCH HAZEL-GLYCERIN EX PADS: 1.0000 "application " | MEDICATED_PAD | CUTANEOUS | Status: DC | PRN

## 2012-08-15 MED ORDER — LANOLIN HYDROUS EX OINT: TOPICAL_OINTMENT | CUTANEOUS | Status: DC | PRN

## 2012-08-15 MED ORDER — BENZOCAINE-MENTHOL 20-0.5 % EX AERO
1.0000 "application " | INHALATION_SPRAY | CUTANEOUS | Status: DC | PRN
  Administered 2012-08-15: 1 via TOPICAL
  Filled 2012-08-15: qty 56

## 2012-08-15 MED ORDER — NALBUPHINE SYRINGE 5 MG/0.5 ML
INJECTION | INTRAMUSCULAR | Status: AC
  Administered 2012-08-15: 10 mg via INTRAVENOUS
  Filled 2012-08-15: qty 1

## 2012-08-15 MED ORDER — ONDANSETRON HCL 4 MG/2ML IJ SOLN: 4.0000 mg | INTRAMUSCULAR | Status: DC | PRN

## 2012-08-15 MED ORDER — DIPHENHYDRAMINE HCL 25 MG PO CAPS: 25.0000 mg | ORAL_CAPSULE | Freq: Four times a day (QID) | ORAL | Status: DC | PRN

## 2012-08-15 MED ORDER — NALBUPHINE HCL 10 MG/ML IJ SOLN
10.0000 mg | INTRAMUSCULAR | Status: DC | PRN
  Filled 2012-08-15: qty 1

## 2012-08-15 MED ORDER — SENNOSIDES-DOCUSATE SODIUM 8.6-50 MG PO TABS
2.0000 | ORAL_TABLET | Freq: Every day | ORAL | Status: DC
  Administered 2012-08-15: 2 via ORAL

## 2012-08-15 MED ORDER — IBUPROFEN 600 MG PO TABS
600.0000 mg | ORAL_TABLET | Freq: Four times a day (QID) | ORAL | Status: DC
  Administered 2012-08-15 – 2012-08-16 (×5): 600 mg via ORAL
  Filled 2012-08-15 (×5): qty 1

## 2012-08-15 NOTE — Progress Notes (Signed)
Denise Casey is a 37 y.o. [id number] at [redacted]w[redacted]d by LMP admitted for induction of labor due to Non-reactive NST and Gestational diabetes.  Subjective: Is feeling pain off and on.  Objective: BP 124/95  Pulse 69  Temp(Src) 97.9 F (36.6 C) (Oral)  Resp 18  Ht 4\' 6"  (1.372 m)  Wt 132 lb (59.875 kg)  BMI 31.81 kg/m2  LMP 11/20/2011     FHT:  FHR: 130s bpm, variability: minimal with periods of moderate,  accelerations:  Present,  decelerations:  Absent UC:   irregular, every 2-3 minutes SVE:   Dilation: 3 Effacement (%): 50 Station: -2 Exam by:: Thad Ranger MD  Labs: Lab Results  Component Value Date   WBC 7.3 08/14/2012   HGB 11.9* 08/14/2012   HCT 34.6* 08/14/2012   MCV 86.5 08/14/2012   PLT 240 08/14/2012    Assessment / Plan: Induction of labor due to non-reassuring fetal testing and gestational diabetes. On pitocin.  Labor: Progressing on Pitocin, will continue to increase then AROM Preeclampsia:  n/a - one elevated diastolic (95) - will continue to monitor closely Fetal Wellbeing:  Category II Pain Control:  Labor support without medications I/D:  GBS negative Anticipated MOD:  NSVD  Denise Casey 08/15/2012, 2:35 AM

## 2012-08-15 NOTE — Progress Notes (Signed)
Forebag ruptured during SVE.

## 2012-08-15 NOTE — Progress Notes (Signed)
UR completed 

## 2012-08-16 MED ORDER — IBUPROFEN 600 MG PO TABS: 600.0000 mg | ORAL_TABLET | Freq: Four times a day (QID) | ORAL | Status: DC

## 2012-08-16 NOTE — Discharge Summary (Signed)
Obstetric Discharge Summary Reason for Admission: Induction of labor due to NR-FHR; diabetes mellitus. Prenatal Procedures: NST and ultrasound Intrapartum Procedures: spontaneous vaginal delivery Postpartum Procedures: glyburide Complications-Operative and Postpartum: 1st degree perineal laceration Hemoglobin  Date Value Range Status  08/14/2012 11.9* 12.0 - 15.0 g/dL Final  40/01/8118 14.7   Final     HCT  Date Value Range Status  08/14/2012 34.6* 36.0 - 46.0 % Final  03/08/2012 33   Final    Physical Exam:  Filed Vitals:   08/16/12 0552  BP: 105/63  Pulse: 80  Temp: 97.9 F (36.6 C)  Resp: 18   CBG (last 3)   Recent Labs  08/14/12 1651 08/15/12 1924 08/15/12 2242  GLUCAP 75 141* 193*   EXAM:   General: alert, cooperative and appears stated age Lochia: appropriate Uterine Fundus: firm Incision: n/a DVT Evaluation: No evidence of DVT seen on physical exam. Negative Homan's sign.  Discharge Diagnoses: Term Pregnancy-delivered and diabetes.  Discharge Information: Date: 08/16/2012 Activity: pelvic rest Diet: routine Medications: Ibuprofen and glyburide (consult with attending regarding meds for diabetes) Condition: stable Instructions: refer to practice specific booklet Discharge to: home Follow-up Information   Follow up with [facility] In 2 weeks. (Continue to check blood sugars at home; bring log to visit.)    Contact information:   [address] [place] [address]       Newborn Data: Live born female  Birth Weight: 8 lb 2.2 oz (3690 g) APGAR: 8, 9  Home with mother.  [facility] 08/16/2012, 7:04 AM

## 2012-08-17 ENCOUNTER — Other Ambulatory Visit: Payer: Self-pay

## 2012-08-19 ENCOUNTER — Inpatient Hospital Stay (HOSPITAL_COMMUNITY): Admission: RE | Admit: 2012-08-19 | Payer: Self-pay | Source: Ambulatory Visit

## 2012-09-18 ENCOUNTER — Encounter: Payer: Self-pay | Admitting: Obstetrics & Gynecology

## 2012-09-18 ENCOUNTER — Ambulatory Visit: Payer: Medicaid Other | Admitting: Obstetrics & Gynecology

## 2012-09-18 DIAGNOSIS — O99810 Abnormal glucose complicating pregnancy: Secondary | ICD-10-CM

## 2012-09-18 NOTE — Progress Notes (Unsigned)
Patient ID: Sharmin Foulk, female   DOB: [birth year], 37 y.o.   MRN: [id number] Subjective:     Kaliyan Osbourn is a 37 y.o. female who presents for a postpartum visit. She is 5 weeks postpartum following a spontaneous vaginal delivery. I have fully reviewed the prenatal and intrapartum course. The delivery was at 38.3  gestational weeks. Outcome: spontaneous vaginal delivery. Anesthesia: IV sedation. Postpartum course has been good. Baby's course has been normal. Baby is feeding by breast. Bleeding no bleeding. Bowel function is normal. Bladder function is normal. Patient is not sexually active. Contraception method is condoms. Postpartum depression screening: negative.  The following portions of the patient's history were reviewed and updated as appropriate: allergies, current medications, past family history, past medical history, past social history, past surgical history and problem list.  Review of Systems Pertinent items are noted in HPI.   Objective:    BP 114/73  Pulse 88  Temp(Src) 98.7 F (37.1 C) (Oral)  Ht 4\' 6"  (1.372 m)  Wt 120 lb 4.8 oz (54.568 kg)  BMI 28.99 kg/m2  Breastfeeding? Yes  General:  alert, cooperative and no distress   Breasts:     Lungs:    Heart:     Abdomen: soft, non-tender; bowel sounds normal; no masses,  no organomegaly   Vulva:  not evaluated  Vagina: not evaluated  Cervix:     Corpus: not examined  Adnexa:  not evaluated  Rectal Exam: Not performed.        Assessment:     normal  postpartum exam. Pap smear not done at today's visit.   Plan:    1. Contraception: condoms 2. Return for 2 hr GTT 3. Follow up in: 1 week or as needed.     Adam Phenix, MD 09/18/2012

## 2012-09-18 NOTE — Patient Instructions (Signed)
Diabetes, Frequently Asked Questions  WHAT IS DIABETES?  Most of the food we eat is turned into glucose (sugar). Our bodies use it for energy. The pancreas makes a hormone called insulin. It helps glucose get into the cells of our bodies. When you have diabetes, your body either does not make enough insulin or cannot use its own insulin as well as it should. This causes sugars to build up in your blood.  WHAT ARE THE SYMPTOMS OF DIABETES?  · Frequent urination.  · Excessive thirst.  · Unexplained weight loss.  · Extreme hunger.  · Blurred vision.  · Tingling or numbness in hands or feet.  · Feeling very tired much of the time.  · Dry, itchy skin.  · Sores that are slow to heal.  · Yeast infections.  WHAT ARE THE TYPES OF DIABETES?  Type 1 Diabetes   · About 10% of affected people have this type.  · Usually occurs before the age of 30.  · Usually occurs in thin to normal weight people.  Type 2 Diabetes  · About 90% of affected people have this type.  · Usually occurs after the age of 40.  · Usually occurs in overweight people.  · More likely to have:  · A family history of diabetes.  · A history of diabetes during pregnancy (gestational diabetes).  · High blood pressure.  · High cholesterol and triglycerides.  Gestational Diabetes  · Occurs in about 4% of pregnancies.  · Usually goes away after the baby is born.  · More likely to occur in women with:  · Family history of diabetes.  · Previous gestational diabetes.  · Obese.  · Over 25 years old.  WHAT IS PRE-DIABETES?  Pre-diabetes means your blood glucose is higher than normal, but lower than the diabetes range. It also means you are at risk of getting type 2 diabetes and heart disease. If you are told you have pre-diabetes, have your blood glucose checked again in 1 to 2 years.  WHAT IS THE TREATMENT FOR DIABETES?  Treatment is aimed at keeping blood glucose near normal levels at all times. Learning how to manage this yourself is important in treating diabetes.  Depending on the type of diabetes you have, your treatment will include one or more of the following:  · Monitoring your blood glucose.  · Meal planning.  · Exercise.  · Oral medicine (pills) or insulin.  CAN DIABETES BE PREVENTED?  With type 1 diabetes, prevention is more difficult, because the triggers that cause it are not yet known.  With type 2 diabetes, prevention is more likely, with lifestyle changes:  · Maintain a healthy weight.  · Eat healthy.  · Exercise.  IS THERE A CURE FOR DIABETES?  No, there is no cure for diabetes. There is a lot of research going on that is looking for a cure, and progress is being made. Diabetes can be treated and controlled. People with diabetes can manage their diabetes and lead normal, active lives.  SHOULD I BE TESTED FOR DIABETES?  If you are at least 35 years old, you should be tested for diabetes. You should be tested again every 3 years. If you are 45 or older and overweight, you may want to get tested more often. If you are younger than 45, overweight, and have one or more of the following risk factors, you should be tested:  · Family history of diabetes.  · Inactive lifestyle.  · High blood pressure.    WHAT ARE SOME OTHER SOURCES FOR INFORMATION ON DIABETES?  The following organizations may help in your search for more information on diabetes:  National Diabetes Education Program (NDEP)  Internet: http://www.ndep.nih.gov/resources  American Diabetes Association  Internet: http://www.diabetes.org   Juvenile Diabetes Foundation International  Internet: http://www.jdf.org  Document Released: 04/22/2003 Document Revised: 07/12/2011 Document Reviewed: 02/14/2009  ExitCare® Patient Information ©2013 ExitCare, LLC.

## 2012-09-26 ENCOUNTER — Other Ambulatory Visit: Payer: Medicaid Other

## 2012-09-26 DIAGNOSIS — O99814 Abnormal glucose complicating childbirth: Secondary | ICD-10-CM

## 2012-09-27 ENCOUNTER — Telehealth: Payer: Self-pay | Admitting: *Deleted

## 2012-09-27 LAB — GLUCOSE TOLERANCE, 2 HOURS W/ 1HR: Glucose, Fasting: 97 mg/dL (ref 70–99)

## 2012-09-27 NOTE — Telephone Encounter (Signed)
Message copied by Mannie Stabile on Wed Sep 27, 2012  4:14 PM ------      Message from: Willodean Rosenthal      Created: Wed Sep 27, 2012  3:25 PM       Please call pt.  Her 2 hour PP glc test is abnormal.  She is a diabetic.  This was discussed with her at her last visit.  Please notify her and assist her with f/u with primary care.            clh-S ------

## 2012-09-27 NOTE — Telephone Encounter (Signed)
Called patient with interpreter. Left message for her to call us back.

## 2012-09-28 NOTE — Telephone Encounter (Signed)
Patient here today for appt- discussed results and recommendations. Patient does not have PCP- referred patient to [facility]

## 2012-11-24 ENCOUNTER — Encounter: Payer: Self-pay | Admitting: *Deleted

## 2014-01-07 IMAGING — US US FETAL BPP W/O NONSTRESS
1 series · 4 of 4 positions shown · non-contrast
Comparison: none

[Series 1: us fetal bpp w/o nonstress · non-contrast · 4 acquisitions, 4 frames shown]
[im 1/4]
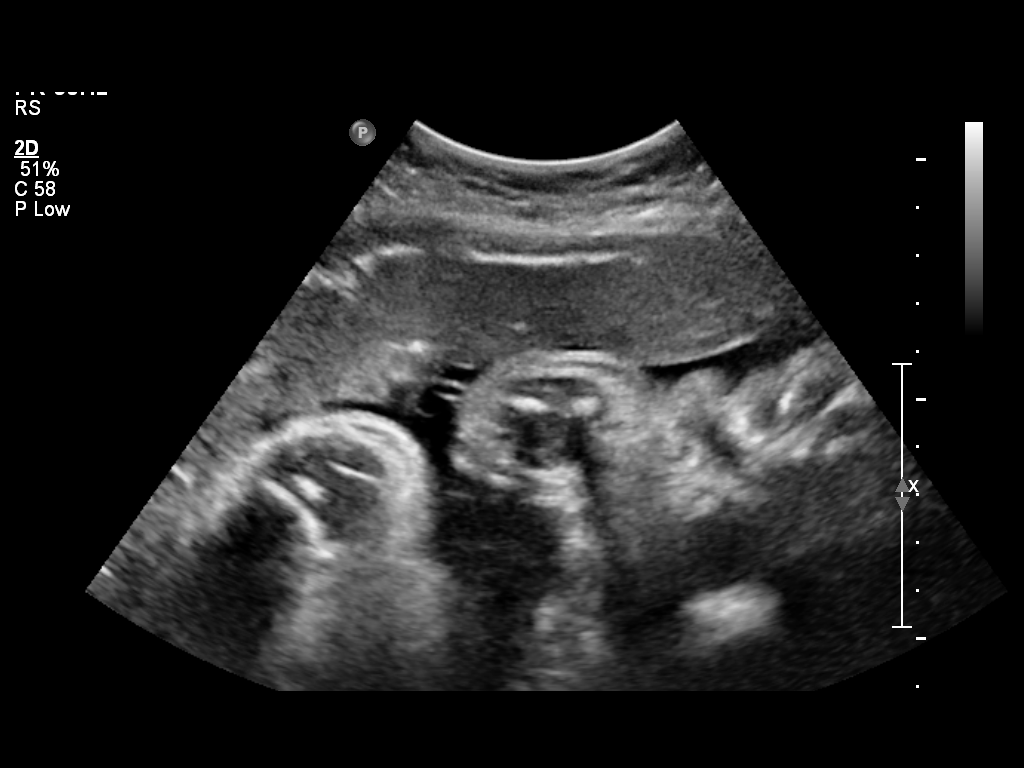
[im 2/4]
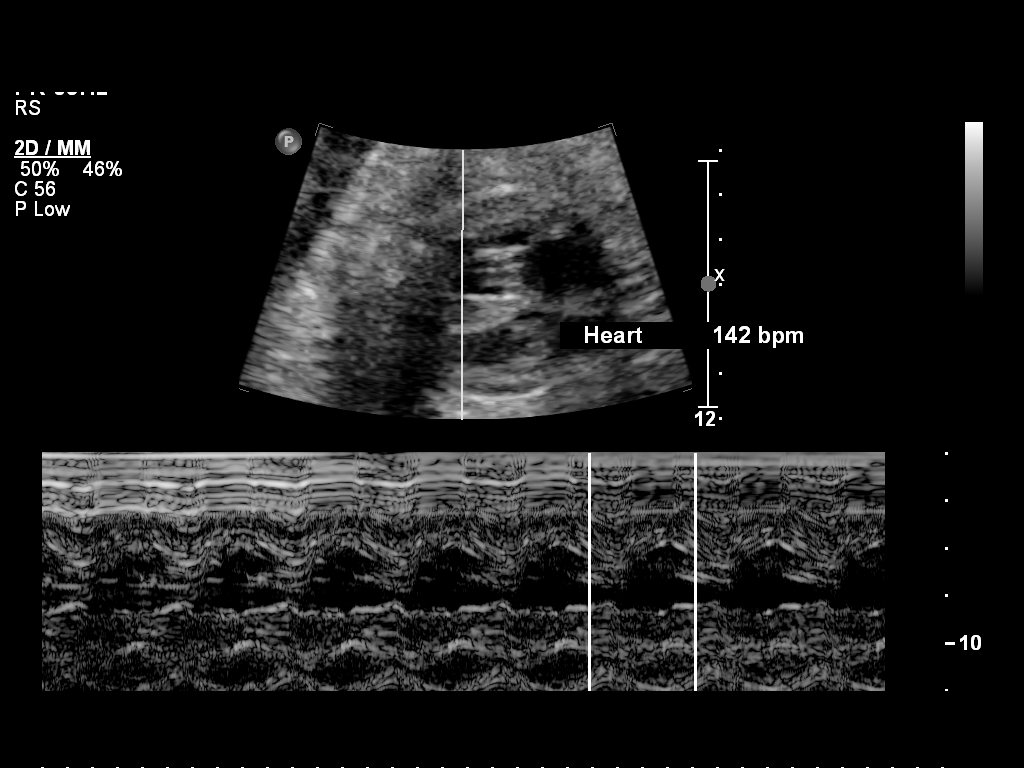
[im 3/4]
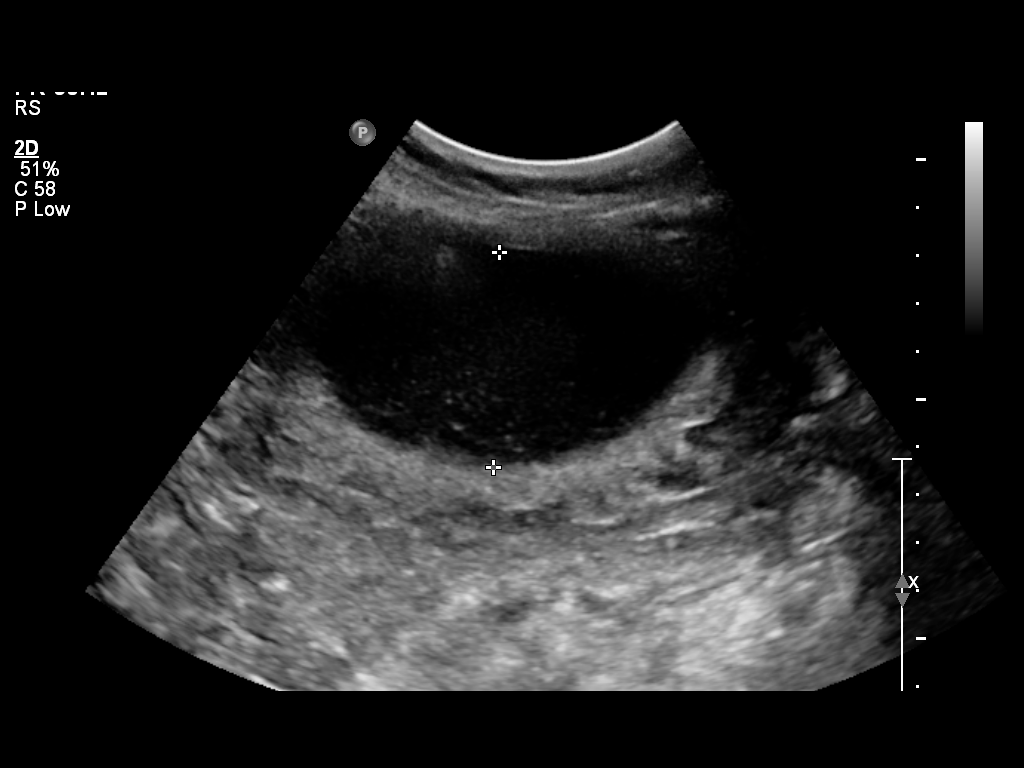
[im 4/4]
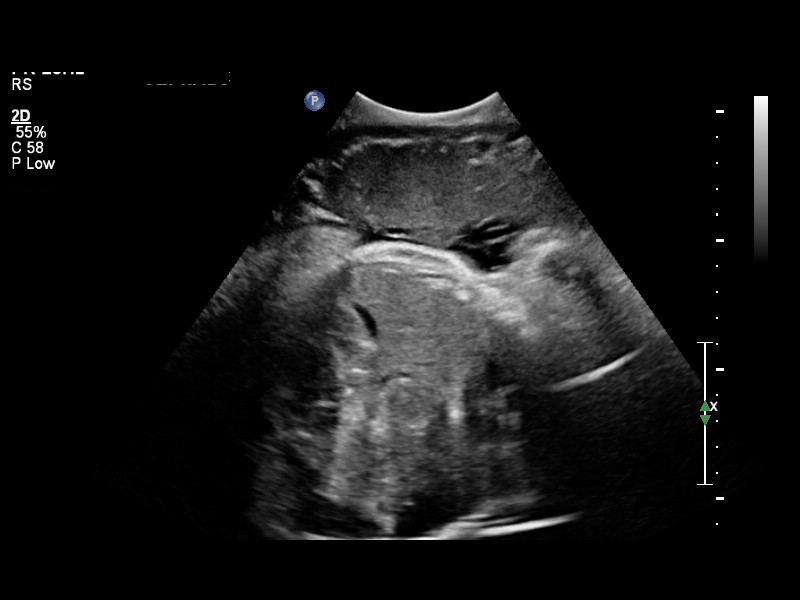

[4 of 4 positions shown; findings below may reference images not displayed]

OBSTETRICS REPORT
                      (Signed Final 08/14/2012 [DATE])

             REDFERN

Service(s) Provided

Indications

 Non-reactive NST, FHR decelerations
Fetal Evaluation

 Num Of Fetuses:    1
 Fetal Heart Rate:  142                         bpm
 Cardiac Activity:  Observed
 Presentation:      Cephalic
 Placenta:          Anterior Fundal, above
                    cervical os

 Amniotic Fluid
 AFI FV:      Subjectively within normal limits
                                             Larg Pckt:   4.47   cm
 LUQ:   4.47   cm
Biophysical Evaluation

 Amniotic F.V:   Within normal limits       F. Tone:        Observed
 F. Movement:    Observed                   Score:          [DATE]
 F. Breathing:   Observed
Gestational Age

 LMP:           38w 2d       Date:   11/20/11                 EDD:   08/26/12
 Best:          38w 2d    Det. By:   LMP  (11/20/11)          EDD:   08/26/12
Impression

 Single live IUP in cephalic presentation.  BPP [DATE].  Called to
 Dr. Muraina [DATE] by sonographer Jeovani Firestone.

 NINA CAROLINE SAMSETH with us.  Please do not hesitate to contact

## 2014-01-07 IMAGING — US US OB FOLLOW-UP
1 series · 12 of 28 positions shown · non-contrast
Comparison: none

[Series 1: us ob follow up · 45 acquisitions, 12 frames shown]
[im 2/45]
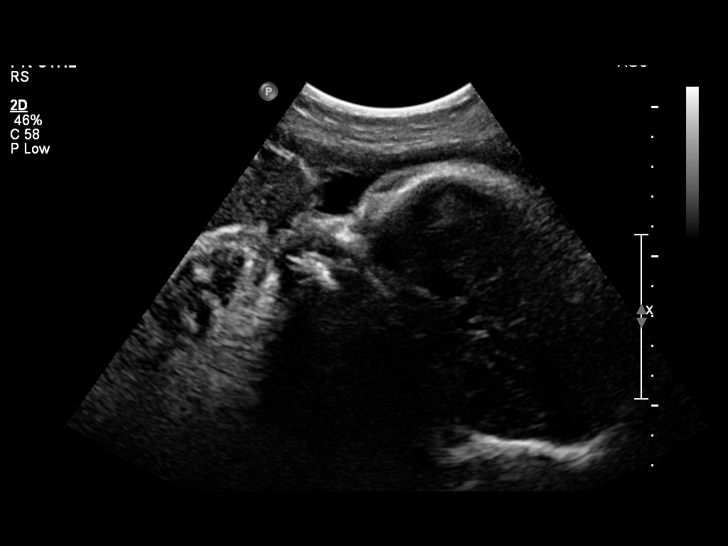
[im 5/45]
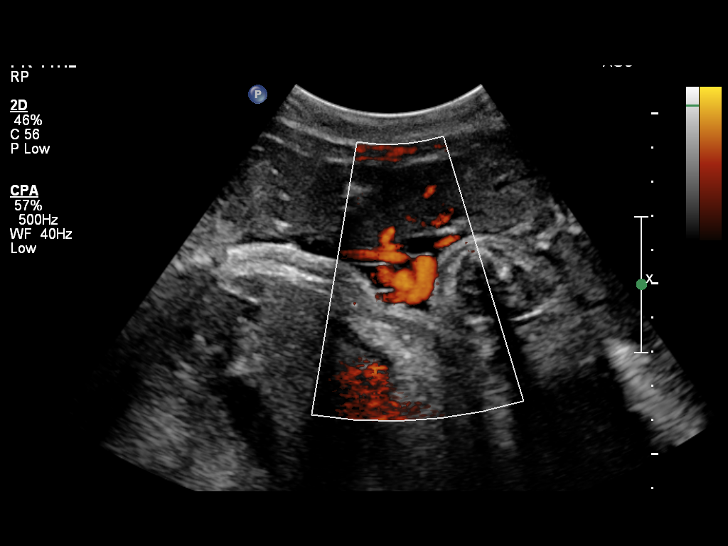
[im 9/45]
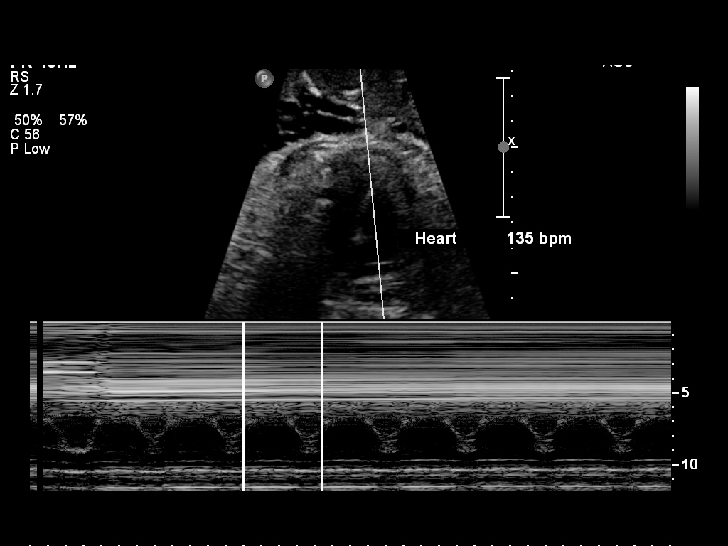
[im 14/45]
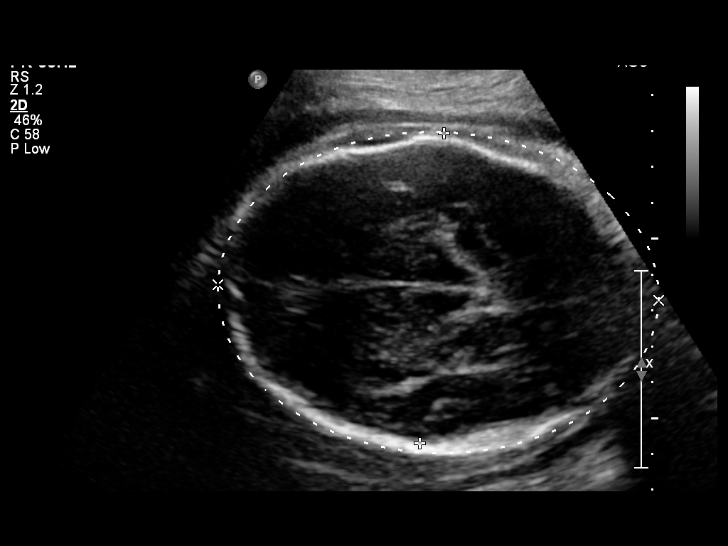
[im 17/45]
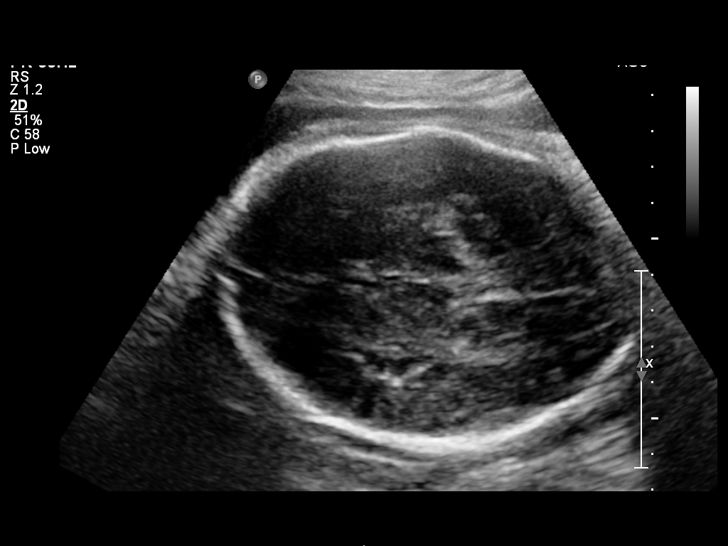
[im 20/45]
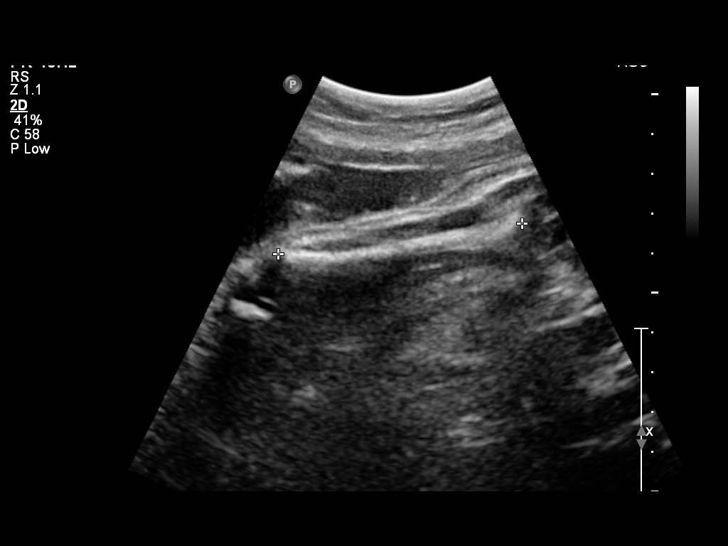
[im 25/45]
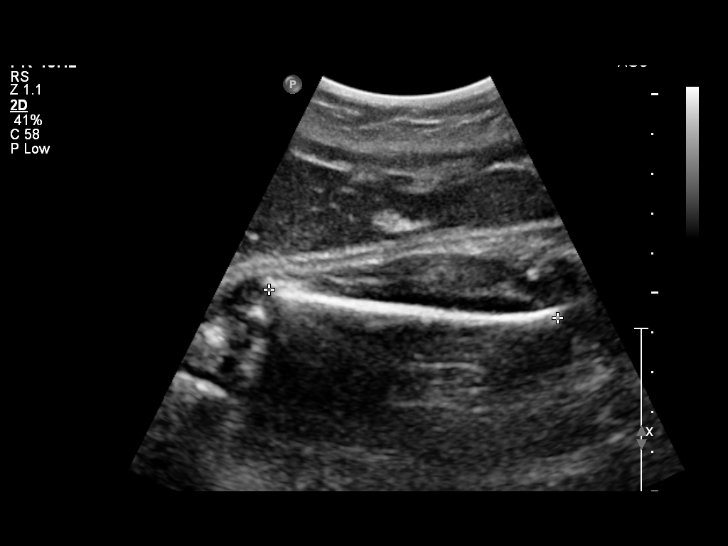
[im 28/45]
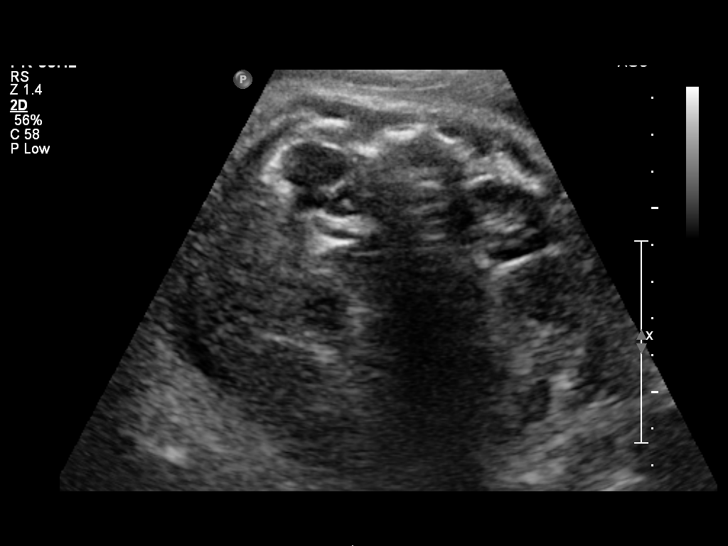
[im 31/45]
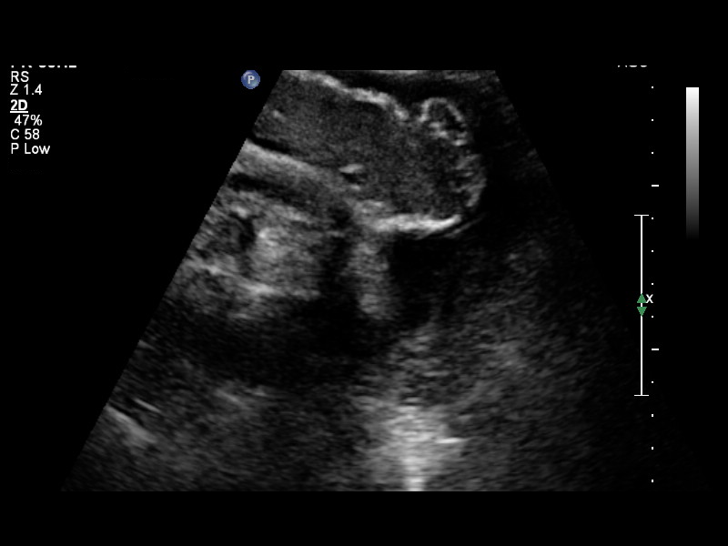
[im 36/45]
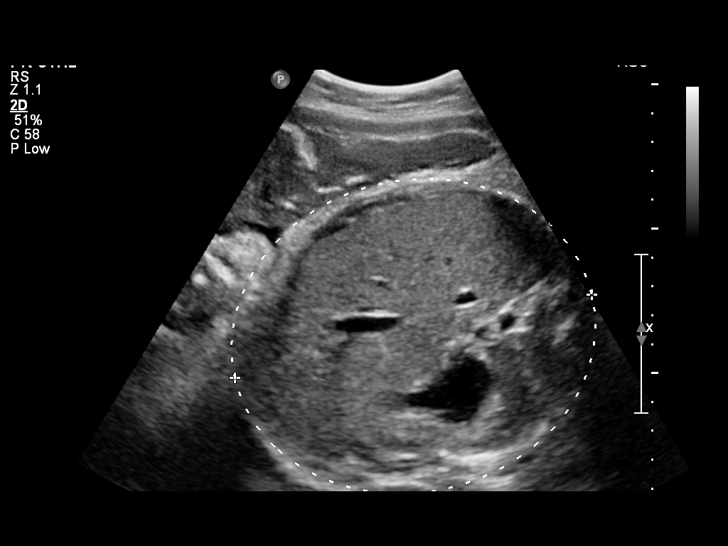
[im 40/45]
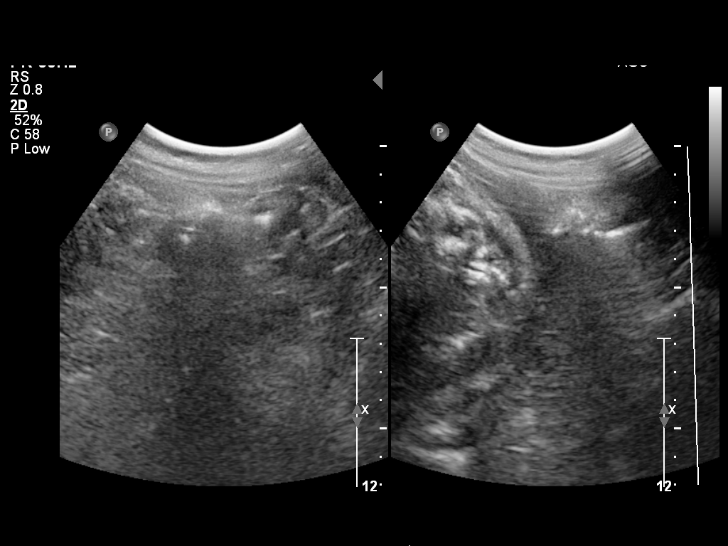
[im 43/45]
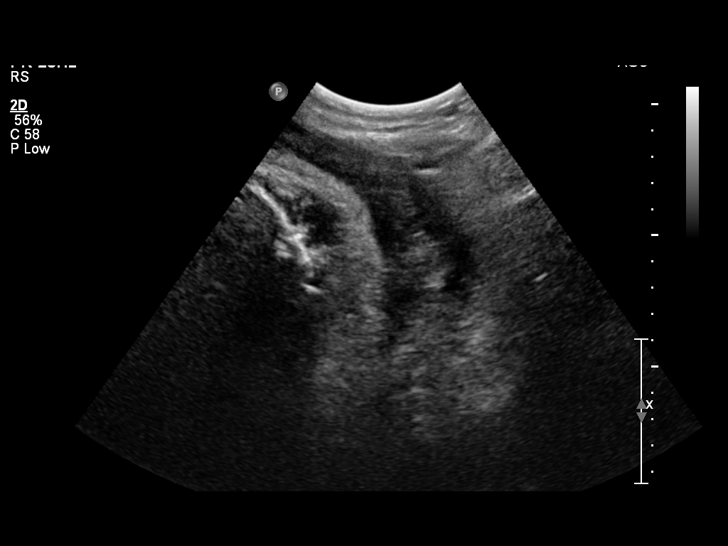

[12 of 28 positions shown; findings below may reference images not displayed]

OBSTETRICS REPORT
                      (Signed Final 08/14/2012 [DATE])

             COIFFEUR

Service(s) Provided

 US OB FOLLOW UP                                       76816.1
Indications

 Diabetes - Gestational, A2 (medication controlled)
 Advanced maternal age (AMA), Multigravida
Fetal Evaluation

 Num Of Fetuses:    1
 Fetal Heart Rate:  135                         bpm
 Cardiac Activity:  Observed
 Presentation:      Cephalic
 Placenta:          Anterior, above cervical os
 P. Cord            Visualized, central
 Insertion:

 Amniotic Fluid
 AFI FV:      Subjectively within normal limits
 AFI Sum:     10.62   cm      31   %Tile     Larg Pckt:   5.65   cm
 RUQ:   2.22   cm    RLQ:    2.75   cm    LUQ:   5.65    cm
Biometry

 BPD:       86  mm    G. Age:   34w 5d                CI:        67.72   70 - 86
                                                      FL/HC:      21.6   20.9 -

 HC:     334.4  mm    G. Age:   38w 2d       31  %    HC/AC:      0.93   0.92 -

 AC:     359.5  mm    G. Age:   39w 6d       95  %    FL/BPD:     83.8   71 - 87
 FL:      72.1  mm    G. Age:   37w 0d       22  %    FL/AC:      20.1   20 - 24
 HUM:     62.5  mm    G. Age:   36w 2d       38  %
 Est. FW:    7166  gm    7 lb 10 oz      81  %
Gestational Age

 LMP:           38w 2d       Date:   11/20/11                 EDD:   08/26/12
 U/S Today:     37w 3d                                        EDD:   09/01/12
 Best:          38w 2d    Det. By:   LMP  (11/20/11)          EDD:   08/26/12
Anatomy

 Cranium:          Appears normal         Aortic Arch:      Previously seen
 Fetal Cavum:      Previously seen        Ductal Arch:      Not well visualized
 Ventricles:       Appears normal         Diaphragm:        Appears normal
 Choroid Plexus:   Previously seen        Stomach:          Appears normal, left
                                                            sided
 Cerebellum:       Previously seen        Abdomen:          Appears normal
 Posterior Fossa:  Previously seen        Abdominal Wall:   Previously seen
 Nuchal Fold:      Previously seen        Cord Vessels:     Previously seen
 Face:             Orbits previously      Kidneys:          Appear normal
                   seen
 Lips:             Previously seen        Bladder:          Appears normal
 Heart:            Appears normal         Spine:            Previously seen
                   (4CH, axis, and
                   situs)
 RVOT:             Appears normal         Lower             Previously seen
                                          Extremities:
 LVOT:             Appears normal         Upper             Previously seen
                                          Extremities:

 Other:  Heels and RT. 5th digit previously visualized. Male gender previously
         seen. Technically difficult due to advanced GA.
Cervix Uterus Adnexa

 Cervix:       Not visualized (advanced GA >34 wks)
 Uterus:       No abnormality visualized.
 Cul De Sac:   No free fluid seen.
 Left Ovary:   Not visualized.
 Right Ovary:  Within normal limits.

 Adnexa:     No abnormality visualized.
Impression

 Single live IUP in cephalic presentation.  Concordant
 measurements/assigned GA by LMP.
 No late-developing anomaly in visualized structures above.

 WIBKE SENDER with us.  Please do not hesitate to contact

## 2014-03-04 ENCOUNTER — Encounter: Payer: Self-pay | Admitting: Obstetrics & Gynecology

## 2015-05-01 ENCOUNTER — Ambulatory Visit: Payer: Medicaid Other

## 2015-05-07 ENCOUNTER — Ambulatory Visit: Payer: Medicaid Other

## 2017-07-16 ENCOUNTER — Other Ambulatory Visit: Payer: Self-pay

## 2017-07-16 ENCOUNTER — Encounter (HOSPITAL_COMMUNITY): Payer: Self-pay | Admitting: Emergency Medicine

## 2017-07-16 DIAGNOSIS — N72 Inflammatory disease of cervix uteri: Secondary | ICD-10-CM | POA: Insufficient documentation

## 2017-07-16 DIAGNOSIS — Z79899 Other long term (current) drug therapy: Secondary | ICD-10-CM | POA: Insufficient documentation

## 2017-07-16 DIAGNOSIS — N760 Acute vaginitis: Secondary | ICD-10-CM | POA: Insufficient documentation

## 2017-07-16 DIAGNOSIS — E119 Type 2 diabetes mellitus without complications: Secondary | ICD-10-CM | POA: Insufficient documentation

## 2017-07-16 LAB — COMPREHENSIVE METABOLIC PANEL
ALT: 18 U/L (ref 14–54)
AST: 20 U/L (ref 15–41)
Albumin: 4.1 g/dL (ref 3.5–5.0)
Alkaline Phosphatase: 45 U/L (ref 38–126)
Anion gap: 10 (ref 5–15)
BUN: 12 mg/dL (ref 6–20)
CO2: 21 mmol/L — ABNORMAL LOW (ref 22–32)
Calcium: 8.8 mg/dL — ABNORMAL LOW (ref 8.9–10.3)
Chloride: 104 mmol/L (ref 101–111)
Creatinine, Ser: 0.68 mg/dL (ref 0.44–1.00)
GFR calc Af Amer: >60 mL/min (ref >60)
GFR calc non Af Amer: >60 mL/min (ref >60)
Glucose, Bld: 229 mg/dL — ABNORMAL HIGH (ref 65–99)
Potassium: 3.8 mmol/L (ref 3.5–5.1)
Sodium: 135 mmol/L (ref 135–145)
Total Bilirubin: 0.7 mg/dL (ref 0.3–1.2)
Total Protein: 7.6 g/dL (ref 6.5–8.1)

## 2017-07-16 LAB — CBC
HCT: 39.6 % (ref 36.0–46.0)
Hemoglobin: 13.6 g/dL (ref 12.0–15.0)
MCH: 30.2 pg (ref 26.0–34.0)
MCHC: 34.3 g/dL (ref 30.0–36.0)
MCV: 88 fL (ref 78.0–100.0)
Platelets: 267 10*3/uL (ref 150–400)
RBC: 4.5 MIL/uL (ref 3.87–5.11)
RDW: 12.5 % (ref 11.5–15.5)
WBC: 8.1 10*3/uL (ref 4.0–10.5)

## 2017-07-16 LAB — URINALYSIS, ROUTINE W REFLEX MICROSCOPIC
Bilirubin Urine: NEGATIVE
Glucose, UA: >=500 mg/dL — AB
Hgb urine dipstick: NEGATIVE
Ketones, ur: NEGATIVE mg/dL
Leukocytes, UA: NEGATIVE
Nitrite: NEGATIVE
Protein, ur: NEGATIVE mg/dL
Specific Gravity, Urine: 1 — ABNORMAL LOW (ref 1.005–1.030)
pH: 7 (ref 5.0–8.0)

## 2017-07-16 LAB — I-STAT BETA HCG BLOOD, ED (MC, WL, AP ONLY)

## 2017-07-16 LAB — LIPASE, BLOOD: LIPASE: 46 U/L (ref 11–51)

## 2017-07-16 NOTE — ED Triage Notes (Signed)
Patient presents to ED for assessment of right sided abdominal and flank pain starting 2 weeks, ago, intermittent in nature, sharp and stabbing sometimes.  Patient c/o some burning with urination.  States she also had horrible constipation a few days ago, but has had BMs since which were normal.  C/o nausea, denies vomiting.

## 2017-07-17 ENCOUNTER — Emergency Department (HOSPITAL_COMMUNITY)
Admission: EM | Admit: 2017-07-17 | Discharge: 2017-07-17 | Disposition: A | Discharge status: Home / Self Care | Payer: Self-pay | Attending: Emergency Medicine | Admitting: Emergency Medicine

## 2017-07-17 DIAGNOSIS — B9689 Other specified bacterial agents as the cause of diseases classified elsewhere: Secondary | ICD-10-CM

## 2017-07-17 DIAGNOSIS — N760 Acute vaginitis: Secondary | ICD-10-CM

## 2017-07-17 DIAGNOSIS — N72 Inflammatory disease of cervix uteri: Secondary | ICD-10-CM

## 2017-07-17 LAB — WET PREP, GENITAL
SPERM: NONE SEEN
Trich, Wet Prep: NONE SEEN
Yeast Wet Prep HPF POC: NONE SEEN

## 2017-07-17 LAB — RAPID HIV SCREEN (HIV 1/2 AB+AG)
HIV 1/2 ANTIBODIES: NONREACTIVE
HIV-1 P24 Antigen - HIV24: NONREACTIVE

## 2017-07-17 LAB — RPR: RPR Ser Ql: NONREACTIVE

## 2017-07-17 MED ORDER — CEFTRIAXONE SODIUM 250 MG IJ SOLR
250.0000 mg | Freq: Once | INTRAMUSCULAR | Status: AC
  Administered 2017-07-17: 250 mg via INTRAMUSCULAR
  Filled 2017-07-17: qty 250

## 2017-07-17 MED ORDER — METRONIDAZOLE 500 MG PO TABS: 500.0000 mg | ORAL_TABLET | Freq: Two times a day (BID) | ORAL | 0 refills | Status: DC

## 2017-07-17 MED ORDER — LIDOCAINE HCL (PF) 1 % IJ SOLN
0.9000 mL | Freq: Once | INTRAMUSCULAR | Status: AC
  Administered 2017-07-17: 0.9 mL
  Filled 2017-07-17: qty 5

## 2017-07-17 MED ORDER — AZITHROMYCIN 250 MG PO TABS
1000.0000 mg | ORAL_TABLET | Freq: Once | ORAL | Status: AC
  Administered 2017-07-17: 1000 mg via ORAL
  Filled 2017-07-17: qty 4

## 2017-07-17 NOTE — ED Provider Notes (Signed)
[facility] EMERGENCY DEPARTMENT Provider Note   CSN: [id number] Arrival date & time: 07/16/17  2114     History   Chief Complaint Chief Complaint  Patient presents with  . Abdominal Pain    HPI Denise Casey is a 42 y.o. female.  HPI   42 year old female with history of diabetes presenting for evaluation of abdominal pain.  Patient report for the past 2 weeks she has had intermittent right-sided abdominal pain.  She described pain as a sharp and crampy sensation, sometimes may last for half a day.  Pain is waxing and waning but have increased in severity and frequency in the past few days.  She did notice some mild discomfort with urination as well as having some vaginal discharge.  Discharge is been ongoing for the past 3 days.  Also report feeling constipated for the past few days however last bowel movement today was normal.  Endorsed occasional nausea without vomiting.  No fever, chills, chest pain, shortness of breath, productive cough, postprandial pain, hematuria, vaginal bleeding or rash.  No new sexual partner.  No specific treatment tried at home.  Pain sometimes aggravated with movement.  Currently rates pain as 5 out of 10.  Past Medical History:  Diagnosis Date  . Diabetes mellitus without complication (HCC)   . Gestational diabetes    glyburide    Patient Active Problem List   Diagnosis Date Noted  . Routine postpartum follow-up 09/18/2012  . Pregnancy, supervision of, high-risk 03/20/2012  . Abnormal maternal glucose tolerance, antepartum 03/20/2012    Past Surgical History:  Procedure Laterality Date  . NO PAST SURGERIES      OB History    Gravida Para Term Preterm AB Living   5 5 5  0 0 5   SAB TAB Ectopic Multiple Live Births   0 0 0 0 5       Home Medications    Prior to Admission medications   Medication Sig Start Date End Date Taking? Authorizing Provider  glyBURIDE (DIABETA) 5 MG tablet Take 1 tablet (5 mg total)  by mouth daily with breakfast. 04/17/12   Adam Phenix, MD  ibuprofen (ADVIL,MOTRIN) 600 MG tablet Take 1 tablet (600 mg total) by mouth every 6 (six) hours. 08/16/12   Marlis Edelson, CNM  Prenatal Vit-Fe Fumarate-FA (MULTIVITAMIN-PRENATAL) 27-0.8 MG TABS Take 1 tablet by mouth daily.    [provider]    Family History Family History  Problem Relation Age of Onset  . Diabetes Mother   . Heart attack Father   . Diabetes Father   . Diabetes Brother     Social History Social History   Tobacco Use  . Smoking status: Never Smoker  . Smokeless tobacco: Never Used  Substance Use Topics  . Alcohol use: No  . Drug use: No     Allergies   Patient has no known allergies.   Review of Systems Review of Systems  All other systems reviewed and are negative.    Physical Exam Updated Vital Signs BP 129/83 (BP Location: Right Arm)   Pulse 84   Temp 98.1 F (36.7 C) (Oral)   Resp 16   SpO2 100%   Physical Exam  Constitutional: She appears well-developed and well-nourished. No distress.  HENT:  Head: Atraumatic.  Eyes: Conjunctivae are normal.  Neck: Neck supple.  Abdominal: Soft. Normal appearance and bowel sounds are normal. There is tenderness in the suprapubic area. There is no rigidity, no guarding, no  CVA tenderness, no tenderness at McBurney's point and negative Murphy's sign. Hernia confirmed negative in the right inguinal area and confirmed negative in the left inguinal area.  Genitourinary:  Genitourinary Comments: Chaperone present during exam.  No inguinal lymphadenopathy or inguinal hernia noted.  Normal external genitalia.  Mild discomfort with speculum insertion.  Small amount of yellow vaginal discharge noted in vaginal vault.  Close cervical os with mild dystrophic skin changes at the T-zone.  On bimanual examination, mild right adnexal tenderness without cervical motion tenderness  Neurological: She is alert.  Skin: No rash noted.  Psychiatric:  She has a normal mood and affect.  Nursing note and vitals reviewed.    ED Treatments / Results  Labs (all labs ordered are listed, but only abnormal results are displayed) Labs Reviewed  WET PREP, GENITAL - Abnormal; Notable for the following components:      Result Value   Clue Cells Wet Prep HPF POC PRESENT (*)    WBC, Wet Prep HPF POC MANY (*)    All other components within normal limits  COMPREHENSIVE METABOLIC PANEL - Abnormal; Notable for the following components:   CO2 21 (*)    Glucose, Bld 229 (*)    Calcium 8.8 (*)    All other components within normal limits  URINALYSIS, ROUTINE W REFLEX MICROSCOPIC - Abnormal; Notable for the following components:   Color, Urine COLORLESS (*)    Specific Gravity, Urine 1.000 (*)    Glucose, UA >=500 (*)    Bacteria, UA RARE (*)    Squamous Epithelial / LPF 0-5 (*)    All other components within normal limits  LIPASE, BLOOD  CBC  RAPID HIV SCREEN (HIV 1/2 AB+AG)  RPR  I-STAT BETA HCG BLOOD, ED ([facility], [facility], [facility] ONLY)  GC/CHLAMYDIA PROBE AMP ([place]) NOT AT [facility]    EKG  EKG Interpretation None       Radiology No results found.  Procedures Procedures (including critical care time)  Medications Ordered in ED Medications  cefTRIAXone (ROCEPHIN) injection 250 mg (not administered)  azithromycin (ZITHROMAX) tablet 1,000 mg (not administered)     Initial Impression / Assessment and Plan / ED Course  I have reviewed the triage vital signs and the nursing notes.  Pertinent labs & imaging results that were available during my care of the patient were reviewed by me and considered in my medical decision making (see chart for details).     BP 107/75   Pulse 68   Temp 98.1 F (36.7 C) (Oral)   Resp 16   SpO2 98%    Final Clinical Impressions(s) / ED Diagnoses   Final diagnoses:  Cervicitis  BV (bacterial vaginosis)    ED Discharge Orders        Ordered    metroNIDAZOLE (FLAGYL) 500 MG tablet  2 times daily      07/17/17 0316     3:18 AM Patient presents with lower abdominal pain.  Also endorsed vaginal discharge.  No pain to right upper quadrant or right lower quadrant to suggest gallbladder etiology or appendicitis.  She does have some right adnexal tenderness on pelvic examination.  Wet prep with many WBC and presence of clue cells.  Patient will be treated for cervicitis with Rocephin and Zithromax, Flagyl for BV.  Return precautions discussed.   Fayrene Helperran, Floriene Jeschke, PA-C 07/17/17 Ranae [id number]    Devoria AlbeKnapp, Iva, MD 07/17/17 [phone]

## 2017-07-17 NOTE — ED Notes (Signed)
Patient is alert and orientedx4.  Patient was explained discharge instructions and they understood them with no questions.   

## 2017-07-17 NOTE — Discharge Instructions (Signed)
You have been evaluated for your abdominal pain.  You have been diagnosed with cervicitis and bacterial vaginosis. Take flagyl as prescribed for the full duration.  Do not drink alcohol while taking antibiotic.  Return if you have any concerns.

## 2017-07-18 LAB — GC/CHLAMYDIA PROBE AMP (~~LOC~~) NOT AT ARMC
Chlamydia: NEGATIVE
NEISSERIA GONORRHEA: NEGATIVE

## 2019-10-06 ENCOUNTER — Ambulatory Visit: Payer: Self-pay | Admitting: Internal Medicine

## 2019-11-10 ENCOUNTER — Other Ambulatory Visit: Payer: Self-pay

## 2019-11-10 ENCOUNTER — Ambulatory Visit: Payer: Self-pay | Admitting: Adult Health

## 2019-11-10 DIAGNOSIS — E119 Type 2 diabetes mellitus without complications: Secondary | ICD-10-CM

## 2019-11-10 MED ORDER — METFORMIN HCL 500 MG PO TABS: 500.0000 mg | ORAL_TABLET | Freq: Two times a day (BID) | ORAL | 3 refills | Status: AC

## 2019-11-10 NOTE — Patient Instructions (Signed)
RTC in 2 weeks:  Labs:  HgbA1c Basic metabolic panel Lipid panel H&H  Meds: Start metformin as instructed  Refer: Diabetic eye exam

## 2019-11-10 NOTE — Progress Notes (Signed)
[facility]   New Patient Office Visit  Subjective:  Patient ID: Denise Casey, female    DOB: Sep 15, 1975  Age: 44 y.o. MRN: [id number]  CC: New patient to establish care  HPI [facility] Penelope Coop presents for diabetes management. She is a diabetic first diagnosed during pregnancy ~ 7 years ago. She has not had a PCP in quite some time. Also, reports right wrist pain  Past Medical History:  Diagnosis Date  . Diabetes mellitus without complication (HCC)   . Gestational diabetes    glyburide    Past Surgical History:  Procedure Laterality Date  . NO PAST SURGERIES      Family History  Problem Relation Age of Onset  . Diabetes Mother   . Heart attack Father   . Diabetes Father   . Diabetes Brother     Social History   Socioeconomic History  . Marital status: Married    Spouse name: Not on file  . Number of children: Not on file  . Years of education: Not on file  . Highest education level: Not on file  Occupational History  . Not on file  Tobacco Use  . Smoking status: Never Smoker  . Smokeless tobacco: Never Used  Substance and Sexual Activity  . Alcohol use: No  . Drug use: No  . Sexual activity: Yes    Birth control/protection: None, Pill  Other Topics Concern  . Not on file  Social History Narrative  . Not on file   Social Determinants of Health   Financial Resource Strain:   . Difficulty of Paying Living Expenses:   Food Insecurity:   . Worried About Programme researcher, broadcasting/film/video in the Last Year:   . Barista in the Last Year:   Transportation Needs:   . Freight forwarder (Medical):   Marland Kitchen Lack of Transportation (Non-Medical):   Physical Activity:   . Days of Exercise per Week:   . Minutes of Exercise per Session:   Stress:   . Feeling of Stress :   Social Connections:   . Frequency of Communication with Friends and Family:   . Frequency of Social Gatherings with Friends and Family:   . Attends Religious Services:   . Active Member of  Clubs or Organizations:   . Attends Banker Meetings:   Marland Kitchen Marital Status:   Intimate Partner Violence:   . Fear of Current or Ex-Partner:   . Emotionally Abused:   Marland Kitchen Physically Abused:   . Sexually Abused:     ROS Review of Systems  Musculoskeletal: Positive for arthralgias.    Objective:   Today's Vitals: There were no vitals taken for this visit.  Physical Exam  Assessment & Plan:  1. Type 2 diabetes mellitus without complication, without long-term current use of insulin (HCC) Previously taking glyburide. Has not taken in quite some time. Checks fasting BG every morning. Reports levels between 125 - 140.   Check HgbA1c, basic metabolic panel, H&H, lipid panel Start metformin 500 mg bid with meals - [facility] to [facility] Refer for diabetic eye exam Discussed daily foot inspection Diet management. May benefit from diabetic nutrition counseling This was a telemedicine visit so unable to check vitals. Will need blood pressure check and management if needed in the future RTC in 2 weeks for lab review and management   2.  Right wrist pain Laquitta is right handed. She c/o pain in her wrist "when she does certain activities. Will try short course  of NSAIDS - Advil 200 mg tid x 7 days. If no improvement will send for imaging of right wrist  Problem List Items Addressed This Visit    None    Visit Diagnoses    Type 2 diabetes mellitus without complication, without long-term current use of insulin (HCC)    -  Primary   Relevant Medications   metFORMIN (GLUCOPHAGE) 500 MG tablet      Outpatient Encounter Medications as of 11/10/2019  Medication Sig  . metFORMIN (GLUCOPHAGE) 500 MG tablet Take 1 tablet (500 mg total) by mouth 2 (two) times daily with a meal.  . [DISCONTINUED] glyBURIDE (DIABETA) 5 MG tablet Take 1 tablet (5 mg total) by mouth daily with breakfast.  . [DISCONTINUED] ibuprofen (ADVIL,MOTRIN) 600 MG tablet Take 1 tablet (600 mg total) by mouth every 6  (six) hours.  . [DISCONTINUED] metroNIDAZOLE (FLAGYL) 500 MG tablet Take 1 tablet (500 mg total) by mouth 2 (two) times daily.  . [DISCONTINUED] Prenatal Vit-Fe Fumarate-FA (MULTIVITAMIN-PRENATAL) 27-0.8 MG TABS Take 1 tablet by mouth daily.   No facility-administered encounter medications on file as of 11/10/2019.    Follow-up:  RTC in 2 weeks for follow up DM and right wrist pain   Uliana Brinker Conni Elliot, NP

## 2019-11-22 ENCOUNTER — Other Ambulatory Visit: Payer: Self-pay | Admitting: Adult Health

## 2019-11-23 LAB — BASIC METABOLIC PANEL
BUN/Creatinine Ratio: 19 (ref 9–23)
BUN: 14 mg/dL (ref 6–24)
CO2: 22 mmol/L (ref 20–29)
Calcium: 9.1 mg/dL (ref 8.7–10.2)
Chloride: 104 mmol/L (ref 96–106)
Creatinine, Ser: 0.72 mg/dL (ref 0.57–1.00)
GFR calc Af Amer: 119 mL/min/{1.73_m2} (ref >59)
GFR calc non Af Amer: 104 mL/min/{1.73_m2} (ref >59)
Glucose: 129 mg/dL — ABNORMAL HIGH (ref 65–99)
Potassium: 4.2 mmol/L (ref 3.5–5.2)
Sodium: 137 mmol/L (ref 134–144)

## 2019-11-23 LAB — HGB A1C W/O EAG: Hgb A1c MFr Bld: 7.5 % — ABNORMAL HIGH (ref 4.8–5.6)

## 2019-11-23 LAB — HEMOGLOBIN: Hemoglobin: 13.7 g/dL (ref 11.1–15.9)

## 2019-11-23 LAB — HEMATOCRIT: Hematocrit: 39.6 % (ref 34.0–46.6)

## 2019-11-23 LAB — LIPID PANEL W/O CHOL/HDL RATIO
Cholesterol, Total: 161 mg/dL (ref 100–199)
HDL: 50 mg/dL (ref >39)
LDL Chol Calc (NIH): 90 mg/dL (ref 0–99)
Triglycerides: 119 mg/dL (ref 0–149)
VLDL Cholesterol Cal: 21 mg/dL (ref 5–40)

## 2019-11-24 ENCOUNTER — Ambulatory Visit: Payer: Self-pay | Admitting: Internal Medicine

## 2019-11-24 ENCOUNTER — Ambulatory Visit: Payer: Self-pay

## 2019-12-08 ENCOUNTER — Ambulatory Visit: Payer: Self-pay | Admitting: Internal Medicine

## 2019-12-08 ENCOUNTER — Encounter: Payer: Self-pay | Admitting: Internal Medicine

## 2019-12-08 ENCOUNTER — Ambulatory Visit: Payer: Self-pay

## 2019-12-08 ENCOUNTER — Other Ambulatory Visit: Payer: Self-pay

## 2019-12-08 DIAGNOSIS — E119 Type 2 diabetes mellitus without complications: Secondary | ICD-10-CM

## 2019-12-08 NOTE — Assessment & Plan Note (Signed)
-   The patient's blood sugar 120-130 under control on Metformin- The patient will continue the current treatment regimen.  - I encouraged the patient to regularly check blood sugar.  - I encouraged the patient to monitor diet. I encouraged the patient to eat low-carb and low-sugar to help prevent blood sugar spikes.  - I encouraged the patient to continue following their prescribed treatment plan for diabetes - I informed the patient to get help if blood sugar drops below 54mg /dL, or if suddenly have trouble thinking clearly or breathing.

## 2019-12-08 NOTE — Progress Notes (Signed)
Established Patient Office Visit  Subjective:  Patient ID: Denise Casey, female    DOB: Oct 26, 1975  Age: 44 y.o. MRN: [id number]  CC: No chief complaint on file.   HPI  [place] presents for diabetes  Past Medical History:  Diagnosis Date   Diabetes mellitus without complication (HCC)    Gestational diabetes    glyburide    Past Surgical History:  Procedure Laterality Date   NO PAST SURGERIES      Family History  Problem Relation Age of Onset   Diabetes Mother    Heart attack Father    Diabetes Father    Diabetes Brother     Social History   Socioeconomic History   Marital status: Married    Spouse name: Not on file   Number of children: Not on file   Years of education: Not on file   Highest education level: Not on file  Occupational History   Not on file  Tobacco Use   Smoking status: Never Smoker   Smokeless tobacco: Never Used  Substance and Sexual Activity   Alcohol use: No   Drug use: No   Sexual activity: Yes    Birth control/protection: None, Pill  Other Topics Concern   Not on file  Social History Narrative   Not on file   Social Determinants of Health   Financial Resource Strain:    Difficulty of Paying Living Expenses:   Food Insecurity:    Worried About Programme researcher, broadcasting/film/video in the Last Year:    Barista in the Last Year:   Transportation Needs:    Freight forwarder (Medical):    Lack of Transportation (Non-Medical):   Physical Activity:    Days of Exercise per Week:    Minutes of Exercise per Session:   Stress:    Feeling of Stress :   Social Connections:    Frequency of Communication with Friends and Family:    Frequency of Social Gatherings with Friends and Family:    Attends Religious Services:    Active Member of Clubs or Organizations:    Attends Engineer, structural:    Marital Status:   Intimate Partner Violence:    Fear of Current or  Ex-Partner:    Emotionally Abused:    Physically Abused:    Sexually Abused:      Current Outpatient Medications:    metFORMIN (GLUCOPHAGE) 500 MG tablet, Take 1 tablet (500 mg total) by mouth 2 (two) times daily with a meal., Disp: 180 tablet, Rfl: 3   No Known Allergies  ROS Review of Systems  Constitutional: Negative.   HENT: Negative.   Eyes: Negative.   Respiratory: Negative.   Cardiovascular: Negative.   Gastrointestinal: Negative.   Endocrine: Negative.   Genitourinary: Negative.   Musculoskeletal: Negative.   Skin: Negative.   Allergic/Immunologic: Negative.   Neurological: Negative.   Hematological: Negative.   Psychiatric/Behavioral: Negative.   All other systems reviewed and are negative.     Objective:    Physical Exam  Virtual Visit  There were no vitals taken for this visit. Wt Readings from Last 3 Encounters:  09/18/12 120 lb 4.8 oz (54.6 kg)  08/14/12 132 lb (59.9 kg)  08/14/12 132 lb (59.9 kg)     Health Maintenance Due  Topic Date Due   Hepatitis C Screening  Never done   PNEUMOCOCCAL POLYSACCHARIDE VACCINE AGE 9-64 HIGH RISK  Never done   FOOT EXAM  Never  done   OPHTHALMOLOGY EXAM  Never done   URINE MICROALBUMIN  Never done   COVID-19 Vaccine (1) Never done   PAP SMEAR-Modifier  03/09/2015   INFLUENZA VACCINE  12/02/2019    There are no preventive care reminders to display for this patient.  Lab Results  Component Value Date   TSH 2.511 04/03/2012   Lab Results  Component Value Date   WBC 8.1 07/16/2017   HGB 13.7 11/22/2019   HCT 39.6 11/22/2019   MCV 88.0 07/16/2017   PLT 267 07/16/2017   Lab Results  Component Value Date   NA 137 11/22/2019   K 4.2 11/22/2019   CO2 22 11/22/2019   GLUCOSE 129 (H) 11/22/2019   BUN 14 11/22/2019   CREATININE 0.72 11/22/2019   BILITOT 0.7 07/16/2017   ALKPHOS 45 07/16/2017   AST 20 07/16/2017   ALT 18 07/16/2017   PROT 7.6 07/16/2017   ALBUMIN 4.1 07/16/2017   CALCIUM  9.1 11/22/2019   ANIONGAP 10 07/16/2017   Lab Results  Component Value Date   CHOL 161 11/22/2019   Lab Results  Component Value Date   HDL 50 11/22/2019   Lab Results  Component Value Date   LDLCALC 90 11/22/2019   Lab Results  Component Value Date   TRIG 119 11/22/2019   Lab Results  Component Value Date   CHOLHDL 2.6 Ratio 02/20/2008   Lab Results  Component Value Date   HGBA1C 7.5 (H) 11/22/2019      Assessment & Plan:   Problem List Items Addressed This Visit      Endocrine   Type 2 diabetes mellitus without complication, without long-term current use of insulin (HCC) - Primary    - The patient's blood sugar 120-130 under control on Metformin- The patient will continue the current treatment regimen.  - I encouraged the patient to regularly check blood sugar.  - I encouraged the patient to monitor diet. I encouraged the patient to eat low-carb and low-sugar to help prevent blood sugar spikes.  - I encouraged the patient to continue following their prescribed treatment plan for diabetes - I informed the patient to get help if blood sugar drops below 54mg /dL, or if suddenly have trouble thinking clearly or breathing.            No orders of the defined types were placed in this encounter.   Follow-up: 3 month   , MD

## 2019-12-08 NOTE — Progress Notes (Signed)
Focus on Diabetes and Heart health diet. Worried about cholesterol levels. Tends to have 1-2 meals a day. Fills up on sodas and coffee which leads to skipping dinner.  Diet:  Breakfast: wake up 7 am, coffee with splenda and sometimes uses vanilla flavored nonfat creamer with no sugar  Lunch: 12 pm sandwich (lettuce, cheese, ham, and cucumbers, whole grain bread 2 slices) or 1 cup of rice with 1 piece chicken with 4 small corn tortilla, fresh hot peppers, drink diet coke  Dinner: 9 pm steak with salsa, 4-5 corn tortillas, fresh hot peppers, drinks with coffee, diet coke, or water, skips dinner sometimes  Eat out twice a week chicken soup or beef soup at [employer] with pasta and bread and salad, sweet ice tea, no dessert  Snacks and Drink: none, drink two cups of coffee a day and two sodas  Physical Activity: 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 and at home you do a lot of yard work for the remaining 4 days of the week  Medications: consistently takes, does not normally miss  Goals: Add in a proper breakfast (eggs and oatmeal/ 1 slice bread/ 2 tortillas), limit tortilla intake 2-3 tortilla at lunch and dinner, snack between breakfast lunch (yogurt and fruit or yogurt and nuts), replace tortillas at meals with vegetables like spinach, squash, cucumbers, lettuce, broccoli, tomatoes, snack between lunch and dinner (yogurt and fruit or yogurt and nuts), olive garden 1 time per week, have chicken or fish instead of steak dinner   Follow up: 3 months, check HgA1c before next appt

## 2020-03-08 ENCOUNTER — Ambulatory Visit: Payer: Self-pay | Admitting: Internal Medicine

## 2020-03-08 ENCOUNTER — Ambulatory Visit: Payer: Self-pay

## 2020-03-22 ENCOUNTER — Ambulatory Visit: Payer: Self-pay | Admitting: Adult Health

## 2020-03-22 ENCOUNTER — Ambulatory Visit: Payer: Self-pay

## 2022-02-23 ENCOUNTER — Other Ambulatory Visit: Payer: Self-pay | Admitting: Obstetrics & Gynecology

## 2022-02-23 DIAGNOSIS — Z1231 Encounter for screening mammogram for malignant neoplasm of breast: Secondary | ICD-10-CM
